# Patient Record
Sex: Female | Born: 1935 | Race: White | Hispanic: No | Smoking: Never smoker
Health system: Southern US, Community
[De-identification: ages and names within clinical notes are randomized; demographics above are authoritative.]

## PROBLEM LIST (undated history)

## (undated) DIAGNOSIS — Z86718 Personal history of other venous thrombosis and embolism: Secondary | ICD-10-CM

## (undated) DIAGNOSIS — C801 Malignant (primary) neoplasm, unspecified: Secondary | ICD-10-CM

## (undated) DIAGNOSIS — M792 Neuralgia and neuritis, unspecified: Secondary | ICD-10-CM

## (undated) DIAGNOSIS — F32A Depression, unspecified: Secondary | ICD-10-CM

## (undated) DIAGNOSIS — F329 Major depressive disorder, single episode, unspecified: Secondary | ICD-10-CM

## (undated) DIAGNOSIS — F419 Anxiety disorder, unspecified: Secondary | ICD-10-CM

## (undated) HISTORY — PX: TONSILLECTOMY: SUR1361

## (undated) HISTORY — PX: KNEE SURGERY: SHX244

## (undated) HISTORY — DX: Personal history of other venous thrombosis and embolism: Z86.718

## (undated) HISTORY — PX: BLADDER SURGERY: SHX569

## (undated) HISTORY — PX: CHOLECYSTECTOMY: SHX55

## (undated) HISTORY — PX: APPENDECTOMY: SHX54

## (undated) HISTORY — PX: BREAST SURGERY: SHX581

## (undated) HISTORY — PX: ABDOMINAL HYSTERECTOMY: SHX81

## (undated) HISTORY — PX: MASTECTOMY: SHX3

## (undated) HISTORY — PX: GALLBLADDER SURGERY: SHX652

---

## 1998-03-22 ENCOUNTER — Ambulatory Visit (HOSPITAL_COMMUNITY): Admission: RE | Admit: 1998-03-22 | Discharge: 1998-03-22 | Payer: Self-pay | Admitting: Neurology

## 1998-09-02 ENCOUNTER — Observation Stay (HOSPITAL_COMMUNITY): Admission: RE | Admit: 1998-09-02 | Discharge: 1998-09-04 | Payer: Self-pay | Admitting: Specialist

## 1999-01-21 ENCOUNTER — Inpatient Hospital Stay (HOSPITAL_COMMUNITY): Admission: EM | Admit: 1999-01-21 | Discharge: 1999-01-26 | Payer: Self-pay | Admitting: Internal Medicine

## 1999-01-22 ENCOUNTER — Encounter: Payer: Self-pay | Admitting: Specialist

## 1999-01-25 ENCOUNTER — Encounter: Payer: Self-pay | Admitting: Orthopedic Surgery

## 1999-02-13 ENCOUNTER — Emergency Department (HOSPITAL_COMMUNITY): Admission: EM | Admit: 1999-02-13 | Discharge: 1999-02-13 | Payer: Self-pay | Admitting: Emergency Medicine

## 1999-02-13 ENCOUNTER — Encounter: Payer: Self-pay | Admitting: Emergency Medicine

## 1999-08-08 ENCOUNTER — Inpatient Hospital Stay (HOSPITAL_COMMUNITY): Admission: EM | Admit: 1999-08-08 | Discharge: 1999-08-10 | Payer: Self-pay | Admitting: Emergency Medicine

## 1999-08-08 ENCOUNTER — Encounter: Payer: Self-pay | Admitting: Emergency Medicine

## 1999-08-09 ENCOUNTER — Encounter: Payer: Self-pay | Admitting: *Deleted

## 1999-12-08 ENCOUNTER — Inpatient Hospital Stay (HOSPITAL_COMMUNITY): Admission: EM | Admit: 1999-12-08 | Discharge: 1999-12-12 | Payer: Self-pay | Admitting: Emergency Medicine

## 1999-12-08 ENCOUNTER — Encounter: Payer: Self-pay | Admitting: Emergency Medicine

## 2000-07-29 ENCOUNTER — Ambulatory Visit (HOSPITAL_COMMUNITY): Admission: RE | Admit: 2000-07-29 | Discharge: 2000-07-29 | Payer: Self-pay | Admitting: *Deleted

## 2001-05-20 ENCOUNTER — Encounter: Admission: RE | Admit: 2001-05-20 | Discharge: 2001-05-20 | Payer: Self-pay | Admitting: Specialist

## 2001-05-20 ENCOUNTER — Encounter: Payer: Self-pay | Admitting: Specialist

## 2002-11-11 ENCOUNTER — Ambulatory Visit (HOSPITAL_COMMUNITY): Admission: RE | Admit: 2002-11-11 | Discharge: 2002-11-11 | Payer: Self-pay

## 2002-11-19 ENCOUNTER — Encounter: Admission: RE | Admit: 2002-11-19 | Discharge: 2002-11-19 | Payer: Self-pay | Admitting: Urology

## 2002-11-19 ENCOUNTER — Encounter: Payer: Self-pay | Admitting: Urology

## 2002-11-25 ENCOUNTER — Ambulatory Visit (HOSPITAL_BASED_OUTPATIENT_CLINIC_OR_DEPARTMENT_OTHER): Admission: RE | Admit: 2002-11-25 | Discharge: 2002-11-25 | Payer: Self-pay | Admitting: Urology

## 2008-11-12 DIAGNOSIS — C50919 Malignant neoplasm of unspecified site of unspecified female breast: Secondary | ICD-10-CM

## 2008-11-12 HISTORY — DX: Malignant neoplasm of unspecified site of unspecified female breast: C50.919

## 2014-11-03 ENCOUNTER — Emergency Department (HOSPITAL_BASED_OUTPATIENT_CLINIC_OR_DEPARTMENT_OTHER): Payer: Medicare Other

## 2014-11-03 ENCOUNTER — Encounter (HOSPITAL_BASED_OUTPATIENT_CLINIC_OR_DEPARTMENT_OTHER): Payer: Self-pay | Admitting: *Deleted

## 2014-11-03 ENCOUNTER — Emergency Department (HOSPITAL_BASED_OUTPATIENT_CLINIC_OR_DEPARTMENT_OTHER)
Admission: EM | Admit: 2014-11-03 | Discharge: 2014-11-03 | Disposition: A | Discharge status: Home / Self Care | Payer: Medicare Other | Attending: Emergency Medicine | Admitting: Emergency Medicine

## 2014-11-03 DIAGNOSIS — M549 Dorsalgia, unspecified: Secondary | ICD-10-CM | POA: Insufficient documentation

## 2014-11-03 DIAGNOSIS — R1011 Right upper quadrant pain: Secondary | ICD-10-CM | POA: Insufficient documentation

## 2014-11-03 DIAGNOSIS — F419 Anxiety disorder, unspecified: Secondary | ICD-10-CM | POA: Diagnosis not present

## 2014-11-03 DIAGNOSIS — M792 Neuralgia and neuritis, unspecified: Secondary | ICD-10-CM | POA: Insufficient documentation

## 2014-11-03 DIAGNOSIS — F329 Major depressive disorder, single episode, unspecified: Secondary | ICD-10-CM | POA: Insufficient documentation

## 2014-11-03 DIAGNOSIS — Z7982 Long term (current) use of aspirin: Secondary | ICD-10-CM | POA: Insufficient documentation

## 2014-11-03 DIAGNOSIS — R1031 Right lower quadrant pain: Secondary | ICD-10-CM | POA: Insufficient documentation

## 2014-11-03 DIAGNOSIS — I82812 Embolism and thrombosis of superficial veins of left lower extremities: Secondary | ICD-10-CM

## 2014-11-03 DIAGNOSIS — I82402 Acute embolism and thrombosis of unspecified deep veins of left lower extremity: Secondary | ICD-10-CM | POA: Diagnosis not present

## 2014-11-03 DIAGNOSIS — R300 Dysuria: Secondary | ICD-10-CM | POA: Diagnosis present

## 2014-11-03 DIAGNOSIS — N134 Hydroureter: Secondary | ICD-10-CM | POA: Diagnosis not present

## 2014-11-03 DIAGNOSIS — Z79899 Other long term (current) drug therapy: Secondary | ICD-10-CM | POA: Diagnosis not present

## 2014-11-03 DIAGNOSIS — Z859 Personal history of malignant neoplasm, unspecified: Secondary | ICD-10-CM | POA: Diagnosis not present

## 2014-11-03 DIAGNOSIS — M79662 Pain in left lower leg: Secondary | ICD-10-CM

## 2014-11-03 HISTORY — DX: Neuralgia and neuritis, unspecified: M79.2

## 2014-11-03 HISTORY — DX: Depression, unspecified: F32.A

## 2014-11-03 HISTORY — DX: Major depressive disorder, single episode, unspecified: F32.9

## 2014-11-03 HISTORY — DX: Anxiety disorder, unspecified: F41.9

## 2014-11-03 HISTORY — DX: Malignant (primary) neoplasm, unspecified: C80.1

## 2014-11-03 LAB — HEPATIC FUNCTION PANEL
ALK PHOS: 57 U/L (ref 39–117)
ALT: 9 U/L (ref 0–35)
AST: 21 U/L (ref 0–37)
Albumin: 3.7 g/dL (ref 3.5–5.2)
BILIRUBIN TOTAL: 0.5 mg/dL (ref 0.3–1.2)
Bilirubin, Direct: 0.1 mg/dL (ref 0.0–0.3)
Indirect Bilirubin: 0.4 mg/dL (ref 0.3–0.9)
Total Protein: 6.7 g/dL (ref 6.0–8.3)

## 2014-11-03 LAB — URINALYSIS, ROUTINE W REFLEX MICROSCOPIC
Bilirubin Urine: NEGATIVE
GLUCOSE, UA: NEGATIVE mg/dL
HGB URINE DIPSTICK: NEGATIVE
Ketones, ur: NEGATIVE mg/dL
LEUKOCYTES UA: NEGATIVE
Nitrite: NEGATIVE
PROTEIN: NEGATIVE mg/dL
SPECIFIC GRAVITY, URINE: 1.011 (ref 1.005–1.030)
Urobilinogen, UA: 0.2 mg/dL (ref 0.0–1.0)
pH: 8 (ref 5.0–8.0)

## 2014-11-03 LAB — CBC WITH DIFFERENTIAL/PLATELET
BASOS PCT: 1 % (ref 0–1)
Basophils Absolute: 0.1 10*3/uL (ref 0.0–0.1)
EOS ABS: 0.1 10*3/uL (ref 0.0–0.7)
EOS PCT: 1 % (ref 0–5)
HCT: 33.3 % — ABNORMAL LOW (ref 36.0–46.0)
Hemoglobin: 10.7 g/dL — ABNORMAL LOW (ref 12.0–15.0)
LYMPHS ABS: 1.9 10*3/uL (ref 0.7–4.0)
Lymphocytes Relative: 24 % (ref 12–46)
MCH: 30.2 pg (ref 26.0–34.0)
MCHC: 32.1 g/dL (ref 30.0–36.0)
MCV: 94.1 fL (ref 78.0–100.0)
Monocytes Absolute: 0.7 10*3/uL (ref 0.1–1.0)
Monocytes Relative: 9 % (ref 3–12)
Neutro Abs: 5.3 10*3/uL (ref 1.7–7.7)
Neutrophils Relative %: 65 % (ref 43–77)
PLATELETS: 185 10*3/uL (ref 150–400)
RBC: 3.54 MIL/uL — ABNORMAL LOW (ref 3.87–5.11)
RDW: 14.2 % (ref 11.5–15.5)
WBC: 8 10*3/uL (ref 4.0–10.5)

## 2014-11-03 LAB — BASIC METABOLIC PANEL
Anion gap: 7 (ref 5–15)
BUN: 17 mg/dL (ref 6–23)
CO2: 26 mmol/L (ref 19–32)
Calcium: 9.1 mg/dL (ref 8.4–10.5)
Chloride: 109 mEq/L (ref 96–112)
Creatinine, Ser: 1.04 mg/dL (ref 0.50–1.10)
GFR calc Af Amer: 53 mL/min — ABNORMAL LOW (ref >90)
GFR calc non Af Amer: 46 mL/min — ABNORMAL LOW (ref >90)
GLUCOSE: 94 mg/dL (ref 70–99)
Potassium: 4.2 mmol/L (ref 3.5–5.1)
SODIUM: 142 mmol/L (ref 135–145)

## 2014-11-03 MED ORDER — IOHEXOL 300 MG/ML  SOLN
100.0000 mL | Freq: Once | INTRAMUSCULAR | Status: AC | PRN
  Administered 2014-11-03: 100 mL via INTRAVENOUS

## 2014-11-03 MED ORDER — ASPIRIN EC 325 MG PO TBEC: 325.0000 mg | DELAYED_RELEASE_TABLET | Freq: Every day | ORAL | Status: DC

## 2014-11-03 MED ORDER — IOHEXOL 300 MG/ML  SOLN
25.0000 mL | Freq: Once | INTRAMUSCULAR | Status: AC | PRN
  Administered 2014-11-03: 25 mL via ORAL

## 2014-11-03 NOTE — ED Notes (Signed)
Pt ambulated to the restroom with 1 person assist. Pt did well, and was able to void

## 2014-11-03 NOTE — ED Provider Notes (Signed)
CSN: [id number]     Arrival date & time 11/03/14  0740 History   First MD Initiated Contact with Patient 11/03/14 [phone]     Chief Complaint  Patient presents with  . Dysuria     (Consider location/radiation/quality/duration/timing/severity/associated sxs/prior Treatment) HPI Comments: Patient reporting multiple complaints including urinary frequency and decreased urine output for approximately 3 days with intermittent dysuria.  She is also had some right lower quadrant, right upper quadrant abdominal pain during this time.  She reports having bilateral leg pain, though later reports it is localized to the left calf.   Patient is a 80 y.o. female presenting with dysuria.  Dysuria Associated symptoms: abdominal pain   Associated symptoms: no fever, no nausea and no vomiting     Past Medical History  Diagnosis Date  . Neuropathic pain   . Depression   . Anxiety   . Cancer    History reviewed. No pertinent past surgical history. History reviewed. No pertinent family history. History  Substance Use Topics  . Smoking status: Never Smoker   . Smokeless tobacco: Not on file  . Alcohol Use: Not on file   OB History    No data available     Review of Systems  Constitutional: Negative for fever, chills, diaphoresis, activity change, appetite change and fatigue.  HENT: Negative for congestion, facial swelling, rhinorrhea and sore throat.   Eyes: Negative for photophobia and discharge.  Respiratory: Negative for cough, chest tightness and shortness of breath.   Cardiovascular: Negative for chest pain, palpitations and leg swelling.       L calf pain  Gastrointestinal: Positive for abdominal pain. Negative for nausea, vomiting and diarrhea.  Endocrine: Negative for polydipsia and polyuria.  Genitourinary: Positive for frequency and decreased urine volume. Negative for difficulty urinating and pelvic pain.  Musculoskeletal: Positive for back pain. Negative for arthralgias, neck pain  and neck stiffness.  Skin: Negative for color change and wound.  Allergic/Immunologic: Negative for immunocompromised state.  Neurological: Negative for facial asymmetry, weakness, numbness and headaches.  Hematological: Does not bruise/bleed easily.  Psychiatric/Behavioral: Negative for confusion and agitation.      Allergies  Sulfa antibiotics  Home Medications   Prior to Admission medications   Medication Sig Start Date End Date Taking? Authorizing Provider  fentaNYL (DURAGESIC - DOSED MCG/HR) 75 MCG/HR Place 75 mcg onto the skin every 3 (three) days.   Yes Historical Provider, MD  gabapentin (NEURONTIN) 400 MG capsule Take 400 mg by mouth 4 (four) times daily. Total of 5 capsules per day   Yes Historical Provider, MD  sertraline (ZOLOFT) 100 MG tablet Take 100 mg by mouth daily.   Yes Historical Provider, MD  aspirin EC 325 MG tablet Take 1 tablet (325 mg total) by mouth daily. 11/03/14   Ernestina Patches, MD   BP 152/64 mmHg  Pulse 52  Temp(Src) 98.4 F (36.9 C) (Oral)  Resp 18  SpO2 96% Physical Exam  Constitutional: She is oriented to person, place, and time. She appears well-developed and well-nourished. No distress.  HENT:  Head: Normocephalic and atraumatic.  Mouth/Throat: No oropharyngeal exudate.  Eyes: Pupils are equal, round, and reactive to light.  Neck: Normal range of motion. Neck supple.  Cardiovascular: Normal rate, regular rhythm and normal heart sounds.  Exam reveals no gallop and no friction rub.   No murmur heard. Pulmonary/Chest: Effort normal and breath sounds normal. No respiratory distress. She has no wheezes. She has no rales.  Abdominal: Soft. Bowel sounds are normal.  She exhibits no distension and no mass. There is tenderness in the right upper quadrant and right lower quadrant. There is no rigidity, no rebound and no guarding.  Musculoskeletal: Normal range of motion. She exhibits no edema.       Back:       Left lower leg: She exhibits  tenderness. She exhibits no bony tenderness, no swelling and no edema.       Legs: Neurological: She is alert and oriented to person, place, and time.  Skin: Skin is warm and dry.  Psychiatric: She has a normal mood and affect.    ED Course  Procedures (including critical care time) Labs Review Labs Reviewed  CBC WITH DIFFERENTIAL - Abnormal; Notable for the following:    RBC 3.54 (*)    Hemoglobin 10.7 (*)    HCT 33.3 (*)    All other components within normal limits  BASIC METABOLIC PANEL - Abnormal; Notable for the following:    GFR calc non Af Amer 46 (*)    GFR calc Af Amer 53 (*)    All other components within normal limits  URINE CULTURE  URINALYSIS, ROUTINE W REFLEX MICROSCOPIC  HEPATIC FUNCTION PANEL    Imaging Review Dg Thoracic Spine 4v  11/03/2014   CLINICAL DATA:  Upper back pain, no known injury, initial encounter  EXAM: THORACIC SPINE - 4+ VIEW  COMPARISON:  06/25/2014  FINDINGS: Increased kyphosis is noted but stable. Mild degenerative changes of the thoracic spine are seen. No compression deformities are noted. Mild interstitial changes are noted in both lungs.  IMPRESSION: No acute abnormality noted.   Electronically Signed   By: Inez Catalina M.D.   On: 11/03/2014 10:48   Ct Abdomen Pelvis W Contrast  11/03/2014   CLINICAL DATA:  Unable to urinate since Friday.  Back pain.  EXAM: CT ABDOMEN AND PELVIS WITH CONTRAST  TECHNIQUE: Multidetector CT imaging of the abdomen and pelvis was performed using the standard protocol following bolus administration of intravenous contrast.  CONTRAST:  175mL OMNIPAQUE IOHEXOL 300 MG/ML SOLN, 33mL OMNIPAQUE IOHEXOL 300 MG/ML SOLN  COMPARISON:  08/08/2010  FINDINGS: Lower chest: There is no pleural effusion. The heart size appears moderately enlarged. Calcified atherosclerotic disease involves the LAD, left circumflex and RCA Coronary artery.  Hepatobiliary: There is no focal liver abnormality identified. The patient is status post  cholecystectomy. Moderate intrahepatic bile duct dilatation is again noted. The common bile duct is increased in caliber as before measuring up to 1.7 cm. No obstructing stone or mass noted.  Pancreas: Appears within normal limits.  Spleen: Appears normal.  Adrenals/Urinary Tract: Normal adrenal glands. There is bilateral pelvocaliectasis. Right hydroureter noted. Cyst within the inferior pole of the left kidney is identified measuring 1.2 cm. Within the upper pole of the left kidney there is a 1.4 cm cyst. The urinary bladder is unremarkable.  Stomach/Bowel: The stomach is normal. The small bowel loops have a normal course and caliber without evidence for bowel obstruction. Normal appearance of the proximal colon. Multiple distal colonic diverticula identified without acute inflammation.  Vascular/Lymphatic: Calcified atherosclerotic disease involves the abdominal aorta. No aneurysm. The abdominal aorta is tortuous. No enlarged retroperitoneal or mesenteric adenopathy. No enlarged pelvic or inguinal lymph nodes.  Reproductive: Previous hysterectomy. Left ovarian cyst is again noted measuring 3 cm, image 67/series 2. Previously 2.2 cm.  Other: There is no ascites or focal fluid collections within the abdomen or pelvis.  Musculoskeletal: There is a scoliosis deformity involving the lumbar spine  which is convex towards the left. Multi level degenerative disc disease is identified involving the lumbar spine. The patient is status post posterior lumbar decompression at L3 through L5.  IMPRESSION: 1. There is mild bilateral pelvocaliectasis and right hydroureter. No evidence for high-grade obstructive uropathy bilaterally. The urinary bladder is unremarkable in appearance. 2. Prior cholecystectomy with marked increase caliber of the intra and extrahepatic bile ducts. This is unchanged from 2011 compatible with a chronic abnormality. 3. Atherosclerotic disease.   Electronically Signed   By: Kerby Moors M.D.   On:  11/03/2014 11:26   US Venous Img Lower Unilateral Left  11/03/2014   CLINICAL DATA:  Left calf pain following fall 4 days previous  EXAM: Left LOWER EXTREMITY VENOUS DOPPLER ULTRASOUND  TECHNIQUE: Gray-scale sonography with graded compression, as well as color Doppler and duplex ultrasound were performed to evaluate the lower extremity deep venous systems from the level of the common femoral vein and including the common femoral, femoral, profunda femoral, popliteal and calf veins including the posterior tibial, peroneal and gastrocnemius veins when visible. The superficial great saphenous vein was also interrogated. Spectral Doppler was utilized to evaluate flow at rest and with distal augmentation maneuvers in the common femoral, femoral and popliteal veins.  COMPARISON:  None.  FINDINGS: Contralateral Common Femoral Vein: Respiratory phasicity is normal and symmetric with the symptomatic side. No evidence of thrombus. Normal compressibility.  Common Femoral Vein: No evidence of thrombus. Normal compressibility, respiratory phasicity and response to augmentation.  Saphenofemoral Junction: No evidence of thrombus. Normal compressibility and flow on color Doppler imaging.  Profunda Femoral Vein: No evidence of thrombus. Normal compressibility and flow on color Doppler imaging.  Femoral Vein: No evidence of thrombus. Normal compressibility, respiratory phasicity and response to augmentation.  Popliteal Vein: No evidence of thrombus. Normal compressibility, respiratory phasicity and response to augmentation.  Calf Veins: No evidence of thrombus. Normal compressibility and flow on color Doppler imaging.  Superficial Great Saphenous Vein: There is a very decreased compressibility in the superior aspect of the calf within the greater saphenous vein which may represent a small area of nonocclusive thrombus.  Venous Reflux:  None.  Other Findings:  None.  IMPRESSION: No deep venous thrombosis is noted. There is a focal  area suspicious for nonocclusive thrombus within the greater saphenous vein in the superior calf.   Electronically Signed   By: Inez Catalina M.D.   On: 11/03/2014 11:26     EKG Interpretation None      MDM   Final diagnoses:  Back pain  Calf pain, left  Acute superficial venous thrombosis of left lower extremity  Hydroureter, right    Pt is a 80 y.o. female with Pmhx as above who presents with multiple complaints including 3 days of urinary frequency with decreased urine output, right lower quadrant and right upper quadrant abdominal pain, left calf pain, and back pain.  Patient also reporting that she had a mechanical fall yesterday and fell onto her back, but did not tell anybody.  She had no loss of consciousness and she denies headache or neck pain.  Son reports she is at her baseline mental status.  Neurologic exam is unremarkable.  Cardiopulmonary exam is benign.  Abdominal exam with right upper quadrant and right lower quadrant tenderness to palpation without rebound or guarding.  Positive right calf tenderness without appreciable swelling.  She reports having multiple abdominal surgeries, which she thinks includes cholecystectomy and appendectomy, but is not sure.  Son states that she  has history of UTIs but has not had kidney stones.  She is also had a history of a bladder surgery in the past.  UA is not consistent with a UTI and I do not feel that her abdominal exam is reliable.   CT shows bilateral mild pelvocaliectasis ectasis and right hydroureter but no evidence for high-grade obstructive uropathy.  Urinary bladder is unremarkable.  She has increased caliper of intra-and extra hepatic bile ducts though this is unchanged from 2011.  She is no acute findings of her thoracic spine x-ray.  Her DVT study is positive for a small area of nonocclusive thrombus in the superficial greater saphenous vein.  I attempted to call Dr. Felipa Eth who is her urologist.  His office is already closed.   Given that she has urinated on her own in the department, her urine looks normal and her creatinine is also normal.  I do not feel that these urinary findings on CT require further intervention today and she can follow-up closely with Dr. Felipa Eth.  I will place her on 325 of aspirin for the superficial thrombosis and recommend elevation and warm and cold compresses.  She can follow up with her primary physician in one week for repeat ultrasound if needed.    Ky Barban evaluation in the Emergency Department is complete. It has been determined that no acute conditions requiring further emergency intervention are present at this time. The patient/guardian have been advised of the diagnosis and plan. We have discussed signs and symptoms that warrant return to the ED, such as changes or worsening in symptoms, inability to urinate for more than 6 hours, worsening pain, fever, worsening swelling or development of redness of the leg.      Ernestina Patches, MD 11/03/14 1344

## 2014-11-03 NOTE — ED Notes (Signed)
Fluid offer to patient.

## 2014-11-03 NOTE — ED Notes (Signed)
Assisted pt to the restroom, pt sat there for a long time and unable to void. Pt reports not able to void in the past 2 days. Bladder scan show 319 ml of urine. Md informed

## 2014-11-03 NOTE — Discharge Instructions (Signed)
Acute Urinary Retention Acute urinary retention is the temporary inability to urinate. This is an uncommon problem in women. It can be caused by:  Infection.  A side effect of a medicine.  A problem in a nearby organ that presses or squeezes on the bladder or the urethra (the tube that drains the bladder).  Psychological problems.   Surgery on your bladder, urethra, or pelvic organs that causes obstruction to the outflow of urine from your bladder. HOME CARE INSTRUCTIONS  If you are sent home with a Foley catheter and a drainage system, you will need to discuss the best course of action with your health care provider. While the catheter is in, maintain a good intake of fluids. Keep the drainage bag emptied and lower than your catheter. This is so that contaminated urine will not flow back into your bladder, which could lead to a urinary tract infection. There are two main types of drainage bags. One is a large bag that usually is used at night. It has a good capacity that will allow you to sleep through the night without having to empty it. The second type is called a leg bag. It has a smaller capacity so it needs to be emptied more frequently. However, the main advantage is that it can be attached by a leg strap and goes underneath your clothing, allowing you the freedom to move about or leave your home. Only take over-the-counter or prescription medicines for pain, discomfort, or fever as directed by your health care provider.  SEEK MEDICAL CARE IF:  You develop a low-grade fever.  You experience spasms or leakage of urine with the spasms. SEEK IMMEDIATE MEDICAL CARE IF:   You develop chills or fever.  Your catheter stops draining urine.  Your catheter falls out.  You start to develop increased bleeding that does not respond to rest and increased fluid intake. MAKE SURE YOU:  Understand these instructions.  Will watch your condition.  Will get help right away if you are not  doing well or get worse. Document Released: 10/28/2006 Document Revised: 08/19/2013 Document Reviewed: 04/09/2013 [facility] Patient Information 2015 [place], [place]. This information is not intended to replace advice given to you by your health care provider. Make sure you discuss any questions you have with your health care provider.   Phlebitis Phlebitis is soreness and swelling (inflammation) of a vein. This can occur in your arms, legs, or torso (trunk), as well as deeper inside your body. Phlebitis is usually not serious when it occurs close to the surface of the body. However, it can cause serious problems when it occurs in a vein deeper inside the body. CAUSES  Phlebitis can be triggered by various things, including:   Reduced blood flow through your veins. This can happen with:  Bed rest over a long period.  Long-distance travel.  Injury.  Surgery.  Being overweight (obese) or pregnant.  Having an IV tube put in the vein and getting certain medicines through the vein.  Cancer and cancer treatment.  Use of illegal drugs taken through the vein.  Inflammatory diseases.  Inherited (genetic) diseases that increase the risk of blood clots.  Hormone therapy, such as birth control pills. SIGNS AND SYMPTOMS   Red, tender, swollen, and painful area on your skin. Usually, the area will be long and narrow.  Firmness along the center of the affected area. This can indicate that a blood clot has formed.  Low-grade fever. DIAGNOSIS  A health care provider can usually  diagnose phlebitis by examining the affected area and asking about your symptoms. To check for infection or blood clots, your health care provider may order blood tests or an ultrasound exam of the area. Blood tests and your family history may also indicate if you have an underlying genetic disease that causes blood clots. Occasionally, a piece of tissue is taken from the body (biopsy sample) if an unusual cause of  phlebitis is suspected. TREATMENT  Treatment will vary depending on the severity of the condition and the area of the body affected. Treatment may include:  Use of a warm compress or heating pad.  Use of compression stockings or bandages.  Anti-inflammatory medicines.  Removal of any IV tube that may be causing the problem.  Medicines that kill germs (antibiotics) if an infection is present.  Blood-thinning medicines if a blood clot is suspected or present.  In rare cases, surgery may be needed to remove damaged sections of vein. HOME CARE INSTRUCTIONS   Only take over-the-counter or prescription medicines as directed by your health care provider. Take all medicines exactly as prescribed.  Raise (elevate) the affected area above the level of your heart as directed by your health care provider.  Apply a warm compress or heating pad to the affected area as directed by your health care provider. Do not sleep with the heating pad.  Use compression stockings or bandages as directed. These will speed healing and prevent the condition from coming back.  If you are on blood thinners:  Get follow-up blood tests as directed by your health care provider.  Check with your health care provider before using any new medicines.  Carry a medical alert card or wear your medical alert jewelry to show that you are on blood thinners.  For phlebitis in the legs:  Avoid prolonged standing or bed rest.  Keep your legs moving. Raise your legs when sitting or lying.  Do not smoke.  Women, particularly those over the age of 72, should consider the risks and benefits of taking the contraceptive pill. This kind of hormone treatment can increase your risk for blood clots.  Follow up with your health care provider as directed. SEEK MEDICAL CARE IF:   You have unusual bruising or any bleeding problems.  Your swelling or pain in the affected area is not improving.  You are on anti-inflammatory  medicine, and you develop belly (abdominal) pain. SEEK IMMEDIATE MEDICAL CARE IF:   You have a sudden onset of chest pain or difficulty breathing.  You have a fever or persistent symptoms for more than 2-3 days.  You have a fever and your symptoms suddenly get worse. MAKE SURE YOU:  Understand these instructions.  Will watch your condition.  Will get help right away if you are not doing well or get worse. Document Released: 10/23/2001 Document Revised: 08/19/2013 Document Reviewed: 07/06/2013 [facility] Patient Information 2015 [place], [place]. This information is not intended to replace advice given to you by your health care provider. Make sure you discuss any questions you have with your health care provider.

## 2014-11-03 NOTE — ED Notes (Signed)
Pt to triage in w/c, reports 3 days of burning dysuria, frequency, urgency and dec uop.

## 2014-11-03 NOTE — ED Notes (Signed)
In and out cath done. Pt tolerated well. Urine specimen send

## 2014-11-04 LAB — URINE CULTURE
Colony Count: NO GROWTH
Culture: NO GROWTH

## 2014-12-23 ENCOUNTER — Emergency Department (HOSPITAL_BASED_OUTPATIENT_CLINIC_OR_DEPARTMENT_OTHER)
Admission: EM | Admit: 2014-12-23 | Discharge: 2014-12-23 | Disposition: A | Discharge status: Home / Self Care | Payer: Medicare Other | Attending: Emergency Medicine | Admitting: Emergency Medicine

## 2014-12-23 ENCOUNTER — Encounter (HOSPITAL_BASED_OUTPATIENT_CLINIC_OR_DEPARTMENT_OTHER): Payer: Self-pay

## 2014-12-23 ENCOUNTER — Emergency Department (HOSPITAL_BASED_OUTPATIENT_CLINIC_OR_DEPARTMENT_OTHER): Payer: Medicare Other

## 2014-12-23 DIAGNOSIS — F419 Anxiety disorder, unspecified: Secondary | ICD-10-CM | POA: Diagnosis not present

## 2014-12-23 DIAGNOSIS — S50312A Abrasion of left elbow, initial encounter: Secondary | ICD-10-CM | POA: Insufficient documentation

## 2014-12-23 DIAGNOSIS — G629 Polyneuropathy, unspecified: Secondary | ICD-10-CM | POA: Diagnosis not present

## 2014-12-23 DIAGNOSIS — S40022A Contusion of left upper arm, initial encounter: Secondary | ICD-10-CM | POA: Diagnosis not present

## 2014-12-23 DIAGNOSIS — Y998 Other external cause status: Secondary | ICD-10-CM | POA: Diagnosis not present

## 2014-12-23 DIAGNOSIS — Z79899 Other long term (current) drug therapy: Secondary | ICD-10-CM | POA: Diagnosis not present

## 2014-12-23 DIAGNOSIS — W01198A Fall on same level from slipping, tripping and stumbling with subsequent striking against other object, initial encounter: Secondary | ICD-10-CM | POA: Insufficient documentation

## 2014-12-23 DIAGNOSIS — Y9289 Other specified places as the place of occurrence of the external cause: Secondary | ICD-10-CM | POA: Diagnosis not present

## 2014-12-23 DIAGNOSIS — Y9389 Activity, other specified: Secondary | ICD-10-CM | POA: Insufficient documentation

## 2014-12-23 DIAGNOSIS — S59902A Unspecified injury of left elbow, initial encounter: Secondary | ICD-10-CM | POA: Diagnosis present

## 2014-12-23 DIAGNOSIS — F329 Major depressive disorder, single episode, unspecified: Secondary | ICD-10-CM | POA: Insufficient documentation

## 2014-12-23 DIAGNOSIS — Z23 Encounter for immunization: Secondary | ICD-10-CM | POA: Insufficient documentation

## 2014-12-23 DIAGNOSIS — S51012A Laceration without foreign body of left elbow, initial encounter: Secondary | ICD-10-CM

## 2014-12-23 DIAGNOSIS — Z7982 Long term (current) use of aspirin: Secondary | ICD-10-CM | POA: Insufficient documentation

## 2014-12-23 DIAGNOSIS — W19XXXA Unspecified fall, initial encounter: Secondary | ICD-10-CM

## 2014-12-23 DIAGNOSIS — Z859 Personal history of malignant neoplasm, unspecified: Secondary | ICD-10-CM | POA: Diagnosis not present

## 2014-12-23 MED ORDER — ACETAMINOPHEN 325 MG PO TABS
650.0000 mg | ORAL_TABLET | Freq: Once | ORAL | Status: AC
  Administered 2014-12-23: 650 mg via ORAL
  Filled 2014-12-23: qty 2

## 2014-12-23 MED ORDER — TETANUS-DIPHTH-ACELL PERTUSSIS 5-2.5-18.5 LF-MCG/0.5 IM SUSP
0.5000 mL | Freq: Once | INTRAMUSCULAR | Status: AC
Start: 2014-12-23 — End: 2014-12-23
  Administered 2014-12-23: 0.5 mL via INTRAMUSCULAR
  Filled 2014-12-23: qty 0.5

## 2014-12-23 NOTE — ED Provider Notes (Signed)
CSN: [id number]     Arrival date & time 12/23/14  2263 History   First MD Initiated Contact with Patient 12/23/14 [phone]     Chief Complaint  Patient presents with  . Fall     (Consider location/radiation/quality/duration/timing/severity/associated sxs/prior Treatment) Patient is a 81 y.o. female presenting with fall. The history is provided by the patient.  Fall This is a new problem. The current episode started 1 to 2 hours ago. Episode frequency: once. The problem has been resolved. Pertinent negatives include no chest pain, no abdominal pain, no headaches and no shortness of breath. Nothing aggravates the symptoms. Nothing relieves the symptoms. She has tried nothing for the symptoms. The treatment provided no relief.    Past Medical History  Diagnosis Date  . Neuropathic pain   . Depression   . Anxiety   . Cancer    Past Surgical History  Procedure Laterality Date  . Abdominal hysterectomy    . Appendectomy    . Cholecystectomy    . Tonsillectomy     History reviewed. No pertinent family history. History  Substance Use Topics  . Smoking status: Never Smoker   . Smokeless tobacco: Not on file  . Alcohol Use: Not on file   OB History    No data available     Review of Systems  Constitutional: Negative for fever and fatigue.  HENT: Negative for congestion and drooling.   Eyes: Negative for pain.  Respiratory: Negative for cough and shortness of breath.   Cardiovascular: Negative for chest pain.  Gastrointestinal: Negative for nausea, vomiting, abdominal pain and diarrhea.  Genitourinary: Negative for dysuria and hematuria.  Musculoskeletal: Negative for back pain, gait problem and neck pain.  Skin: Negative for color change.  Neurological: Negative for dizziness and headaches.  Hematological: Negative for adenopathy.  Psychiatric/Behavioral: Negative for behavioral problems.  All other systems reviewed and are negative.     Allergies  Sulfa  antibiotics  Home Medications   Prior to Admission medications   Medication Sig Start Date End Date Taking? Authorizing Provider  fentaNYL (DURAGESIC - DOSED MCG/HR) 75 MCG/HR Place 75 mcg onto the skin every 3 (three) days.   Yes Historical Provider, MD  gabapentin (NEURONTIN) 400 MG capsule Take 400 mg by mouth 4 (four) times daily. Total of 5 capsules per day   Yes Historical Provider, MD  omeprazole (PRILOSEC) 20 MG capsule Take 20 mg by mouth daily.   Yes Historical Provider, MD  sertraline (ZOLOFT) 100 MG tablet Take 100 mg by mouth daily.   Yes Historical Provider, MD  aspirin EC 325 MG tablet Take 1 tablet (325 mg total) by mouth daily. 11/03/14   Ernestina Patches, MD   BP 190/74 mmHg  Pulse 69  Temp(Src) 97.4 F (36.3 C) (Oral)  Resp 18  SpO2 100% Physical Exam  Constitutional: She is oriented to person, place, and time. She appears well-developed and well-nourished.  HENT:  Head: Normocephalic.  Mouth/Throat: Oropharynx is clear and moist. No oropharyngeal exudate.  Eyes: Conjunctivae and EOM are normal. Pupils are equal, round, and reactive to light.  Neck: Normal range of motion. Neck supple.  Cardiovascular: Normal rate, regular rhythm, normal heart sounds and intact distal pulses.  Exam reveals no gallop and no friction rub.   No murmur heard. Pulmonary/Chest: Effort normal and breath sounds normal. No respiratory distress. She has no wheezes.  Abdominal: Soft. Bowel sounds are normal. There is no tenderness. There is no rebound and no guarding.  Musculoskeletal: Normal range  of motion. She exhibits tenderness. She exhibits no edema.  Mild abrasions and moderate sized skin tear to the left elbow and left lateral upper arm. Tenderness to palpation of the left upper arm and left elbow.  Normal motor skills in the left hand. 2+ distal pulses in the upper extremities.  Old appearing ecchymosis to the right lateral upper arm.  Neurological: She is alert and oriented to  person, place, and time.  alert, oriented x3 speech: normal in context and clarity memory: intact grossly cranial nerves II-XII: intact motor strength: full proximally and distally sensation: intact to light touch diffusely     Skin: Skin is warm and dry.  Psychiatric: She has a normal mood and affect. Her behavior is normal.  Nursing note and vitals reviewed.   ED Course  Procedures (including critical care time) Labs Review Labs Reviewed - No data to display  Imaging Review Dg Elbow Complete Left  12/23/2014   CLINICAL DATA:  Elbow pain secondary to a fall today.  EXAM: LEFT ELBOW - COMPLETE 3+ VIEW  COMPARISON:  None.  FINDINGS: There is no fracture or dislocation or joint effusion. Minimal arthritic changes.  IMPRESSION: No significant abnormality.   Electronically Signed   By: Lorriane Shire M.D.   On: 12/23/2014 10:14   Dg Humerus Left  12/23/2014   CLINICAL DATA:  Left upper arm and elbow pain secondary to a fall today.  EXAM: LEFT HUMERUS - 2+ VIEW  COMPARISON:  None.  FINDINGS: There is no fracture or dislocation. Mild cystic degenerative changes in the greater tuberosity of the proximal humerus.  IMPRESSION: No significant abnormality.   Electronically Signed   By: Lorriane Shire M.D.   On: 12/23/2014 10:14     EKG Interpretation None      MDM   Final diagnoses:  Fall  Skin tear of elbow without complication, left, initial encounter  Contusion of left arm, initial encounter    9:49 AM 81 y.o. female who presents with a mechanical fall which occurred about 1.5 hours ago. She states that she had gotten up to use the bathroom and slipped on the vinyl floor falling backwards and hitting the back of her head on the carpet. She denies any loss of consciousness. She denies any headache. Her only complaint is some left elbow and left arm pain. She is afebrile and  Mildly hypertensive here. I discussed CT imaging of the head with the family but do not think this is necessary  given no evidence of head trauma on exam and patient denies headache. The family is happy with this.  10:49 AM: I interpreted/reviewed the labs and/or imaging which were non-contributory.  Likely contusion. DTaP was updated. The patient continues to appear well and is ambulatory. Will recommend symptomatically treatment for the skin tear on the left arm. We'll place in a sling for comfort but patient and family were warned about frozen shoulder. I have discussed the diagnosis/risks/treatment options with the patient and family and believe the pt to be eligible for discharge home to follow-up with her pcp as needed. We also discussed returning to the ED immediately if new or worsening sx occur. We discussed the sx which are most concerning (e.g., further falls, worsening pain) that necessitate immediate return. Medications administered to the patient during their visit and any new prescriptions provided to the patient are listed below.  Medications given during this visit Medications  acetaminophen (TYLENOL) tablet 650 mg (650 mg Oral Given 12/23/14 1012)  Tdap (Remy)  injection 0.5 mL (0.5 mLs Intramuscular Given 12/23/14 1014)    New Prescriptions   No medications on file     Pamella Pert, MD 12/23/14 1050

## 2014-12-23 NOTE — Discharge Instructions (Signed)
Contusion °A contusion is a deep bruise. Contusions happen when an injury causes bleeding under the skin. Signs of bruising include pain, puffiness (swelling), and discolored skin. The contusion may turn blue, purple, or yellow. °HOME CARE  °· Put ice on the injured area. °¨ Put ice in a plastic bag. °¨ Place a towel between your skin and the bag. °¨ Leave the ice on for 15-20 minutes, 03-04 times a day. °· Only take medicine as told by your doctor. °· Rest the injured area. °· If possible, raise (elevate) the injured area to lessen puffiness. °GET HELP RIGHT AWAY IF:  °· You have more bruising or puffiness. °· You have pain that is getting worse. °· Your puffiness or pain is not helped by medicine. °MAKE SURE YOU:  °· Understand these instructions. °· Will watch your condition. °· Will get help right away if you are not doing well or get worse. °Document Released: 04/16/2008 Document Revised: 01/21/2012 Document Reviewed: 09/03/2011 °Exit[employer]® Patient Information ©2015 [employer]. This information is not intended to replace advice given to you by your health care provider. Make sure you discuss any questions you have with your health care provider. ° °

## 2014-12-23 NOTE — ED Notes (Signed)
LUE propped up on a pillow for comfort

## 2014-12-23 NOTE — ED Notes (Signed)
Patient transported to X-ray 

## 2014-12-23 NOTE — ED Notes (Signed)
Pt reports she slipped this morning on bathroom tile floor, denies being dizzy, reports hitting the back side of her head, denies being on blood thinners, denies LOC. Left upper arm, left elbow skin tears noted - also edema noted to left upper arm with pain.

## 2014-12-23 NOTE — ED Notes (Signed)
MD at bedside. 

## 2016-08-15 IMAGING — CT CT ABD-PELV W/ CM
2 of 5 series · 15 of 46 positions shown, 17 images · IV contrast (APPLIED)
Comparison: 08/08/2010

CLINICAL DATA: Unable to urinate since [REDACTED].  Back pain.

EXAM:
CT ABDOMEN AND PELVIS WITH CONTRAST
TECHNIQUE: Multidetector CT imaging of the abdomen and pelvis was performed
using the standard protocol following bolus administration of
intravenous contrast.
CONTRAST:  100mL OMNIPAQUE IOHEXOL 300 MG/ML SOLN, 25mL OMNIPAQUE
IOHEXOL 300 MG/ML SOLN

[Series 2: abd/pelvis 5.0 b31f · axial · 0.76mm/px · z∈[-431,+19]mm · 12 of 100 slices shown, 14 images]
[im 5/100  soft-tissue]
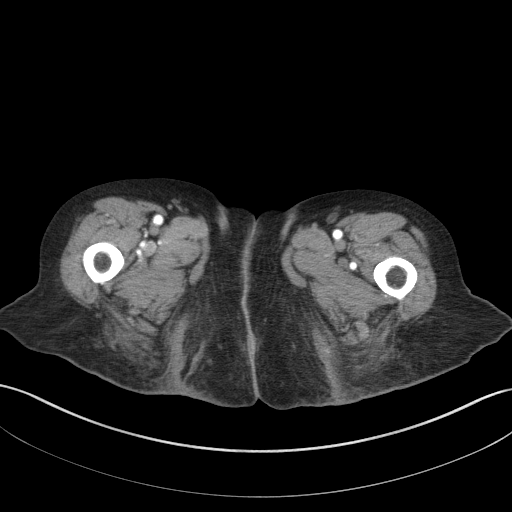
[im 5/100  bone]
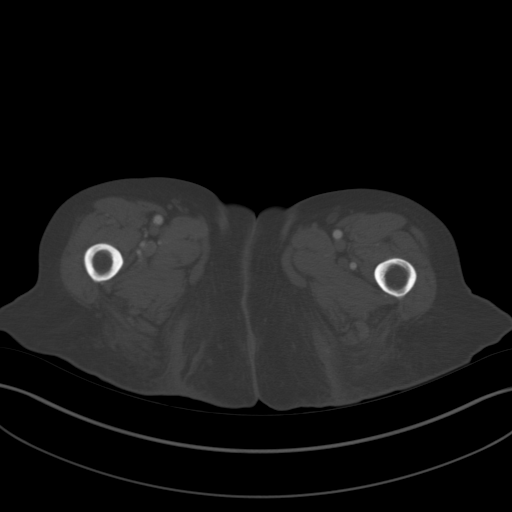
[im 15/100  soft-tissue]
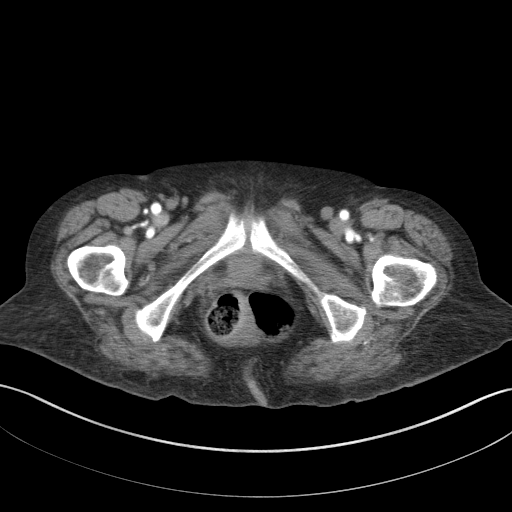
[im 20/100  soft-tissue]
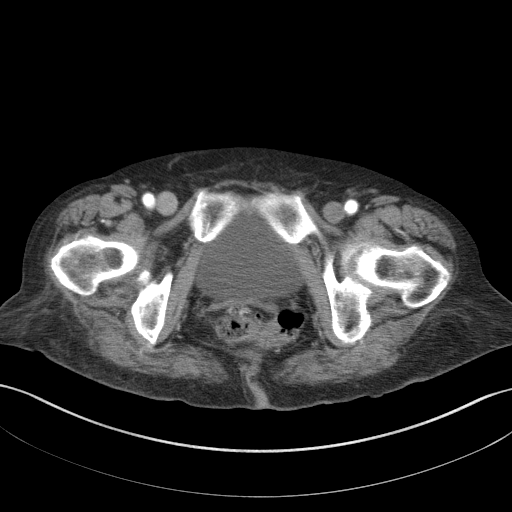
[im 30/100  soft-tissue]
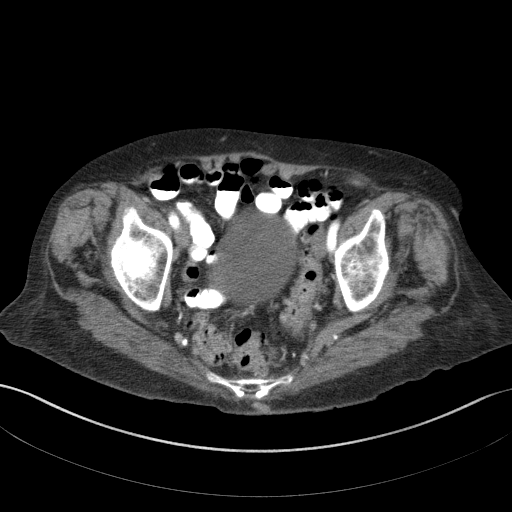
[im 40/100  soft-tissue]
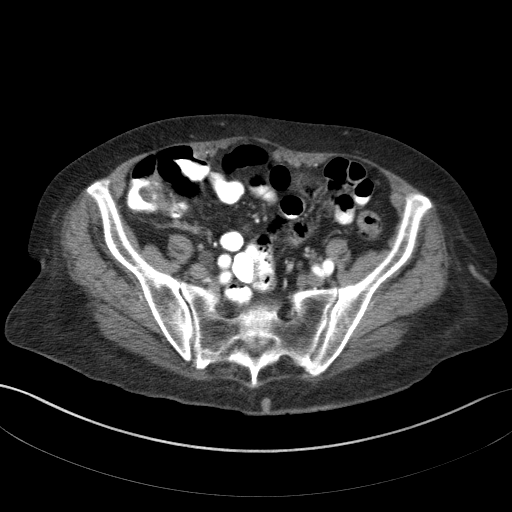
[im 45/100  soft-tissue]
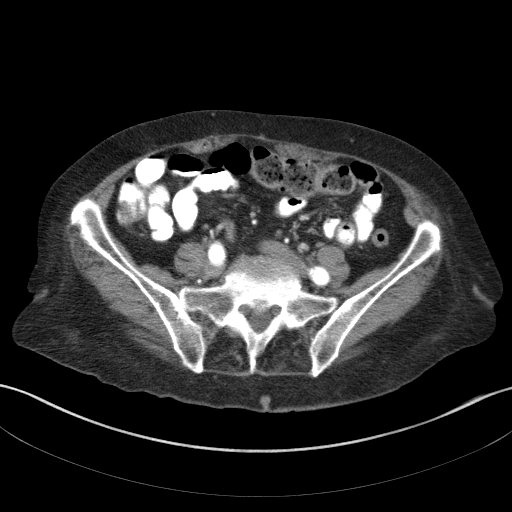
[im 55/100  soft-tissue]
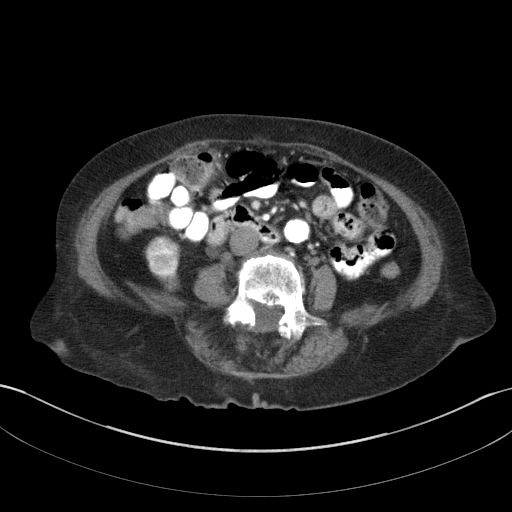
[im 60/100  soft-tissue]
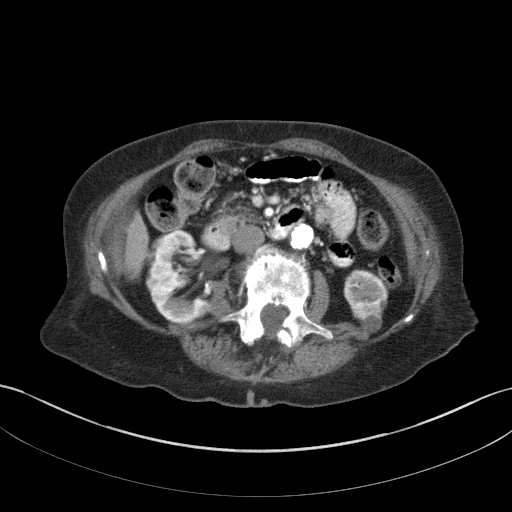
[im 70/100  soft-tissue]
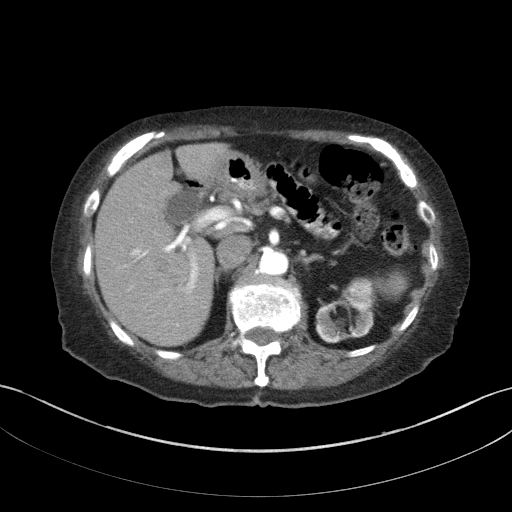
[im 70/100  bone]
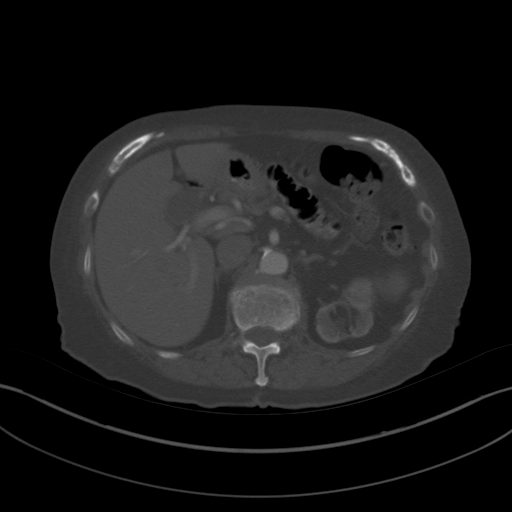
[im 80/100  soft-tissue]
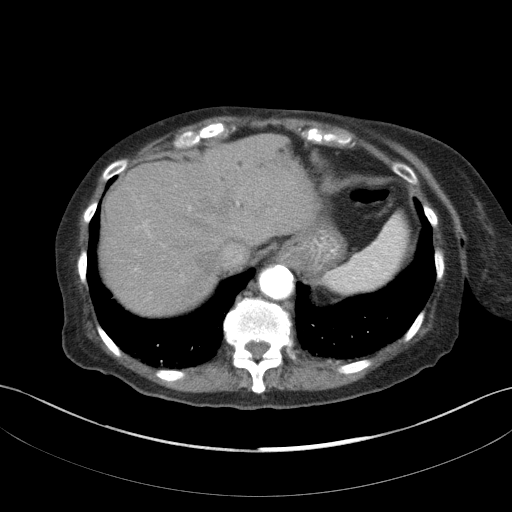
[im 85/100  soft-tissue]
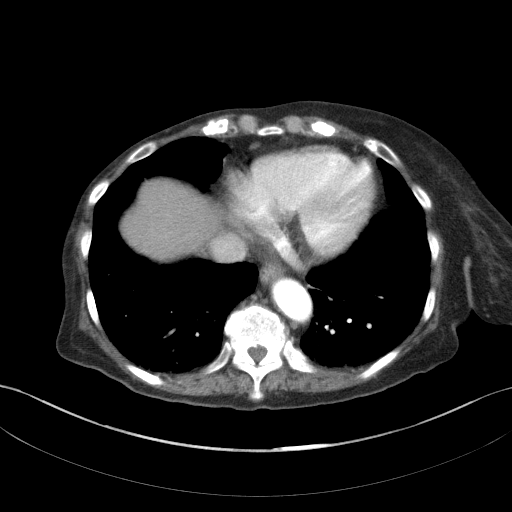
[im 95/100  soft-tissue]
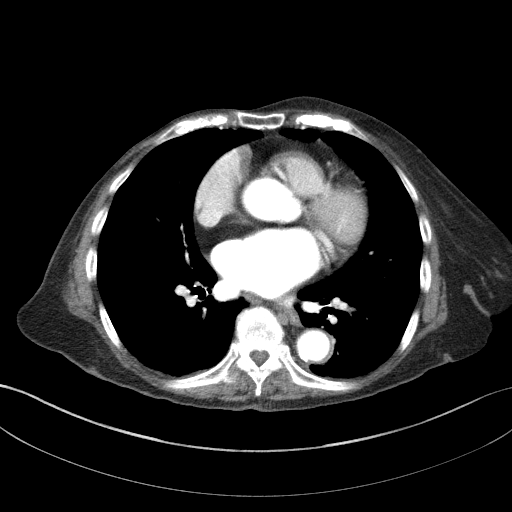

[Series 5: abd/pelvis 3.0 coronal · coronal · 0.79mm/px · 3 of 79 slices shown]
[im 27/79  soft-tissue]
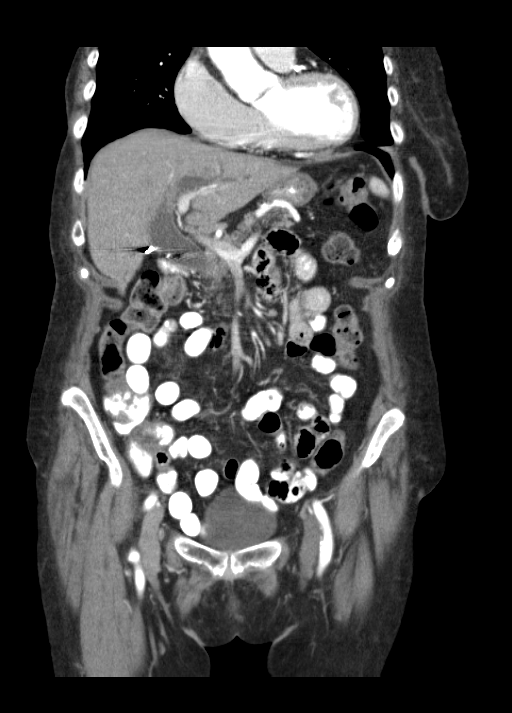
[im 35/79  soft-tissue]
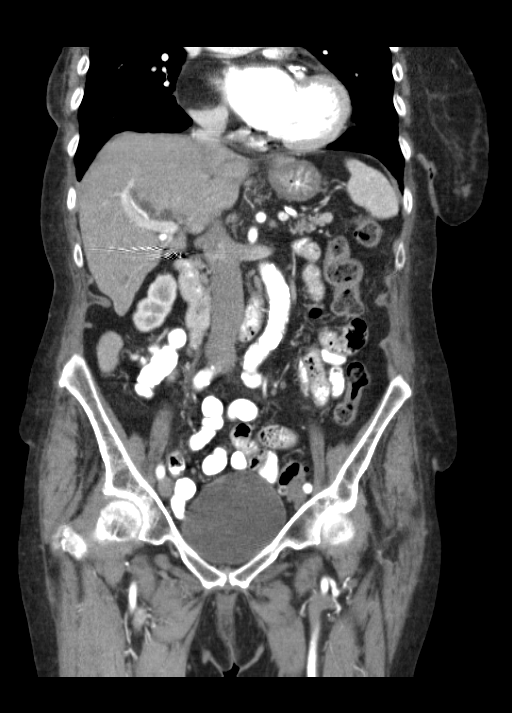
[im 44/79  soft-tissue]
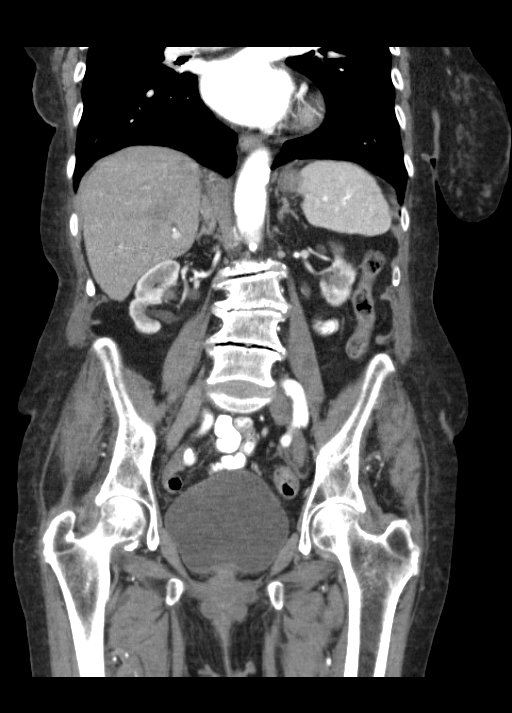

[15 of 46 positions shown; findings below may reference images not displayed]

FINDINGS: Lower chest: There is no pleural effusion. The heart size appears
moderately enlarged. Calcified atherosclerotic disease involves the
LAD, left circumflex and RCA Coronary artery.

Hepatobiliary: There is no focal liver abnormality identified. The
patient is status post cholecystectomy. Moderate intrahepatic bile
duct dilatation is again noted. The common bile duct is increased in
caliber as before measuring up to 1.7 cm. No obstructing stone or
mass noted.

Pancreas: Appears within normal limits.

Spleen: Appears normal.

Adrenals/Urinary Tract: Normal adrenal glands. There is bilateral
pelvocaliectasis. Right hydroureter noted. Cyst within the inferior
pole of the left kidney is identified measuring 1.2 cm. Within the
upper pole of the left kidney there is a 1.4 cm cyst. The urinary
bladder is unremarkable.

Stomach/Bowel: The stomach is normal. The small bowel loops have a
normal course and caliber without evidence for bowel obstruction.
Normal appearance of the proximal colon. Multiple distal colonic
diverticula identified without acute inflammation.

Vascular/Lymphatic: Calcified atherosclerotic disease involves the
abdominal aorta. No aneurysm. The abdominal aorta is tortuous. No
enlarged retroperitoneal or mesenteric adenopathy. No enlarged
pelvic or inguinal lymph nodes.

Reproductive: Previous hysterectomy. Left ovarian cyst is again
noted measuring 3 cm, image 67/series 2. Previously 2.2 cm.

Other: There is no ascites or focal fluid collections within the
abdomen or pelvis.

Musculoskeletal: There is a scoliosis deformity involving the lumbar
spine which is convex towards the left. Multi level degenerative
disc disease is identified involving the lumbar spine. The patient
is status post posterior lumbar decompression at L3 through L5.
IMPRESSION: 1. There is mild bilateral pelvocaliectasis and right hydroureter.
No evidence for high-grade obstructive uropathy bilaterally. The
urinary bladder is unremarkable in appearance.
2. Prior cholecystectomy with marked increase caliber of the intra
and extrahepatic bile ducts. This is unchanged from 0088 compatible
with a chronic abnormality.
3. Atherosclerotic disease.

## 2016-10-04 IMAGING — CR DG HUMERUS 2V *L*
2 series · 2 of 2 positions shown · non-contrast
Comparison: None.

CLINICAL DATA: Left upper arm and elbow pain secondary to a fall
today.

EXAM:
LEFT HUMERUS - 2+ VIEW

[w humerus ap left *]
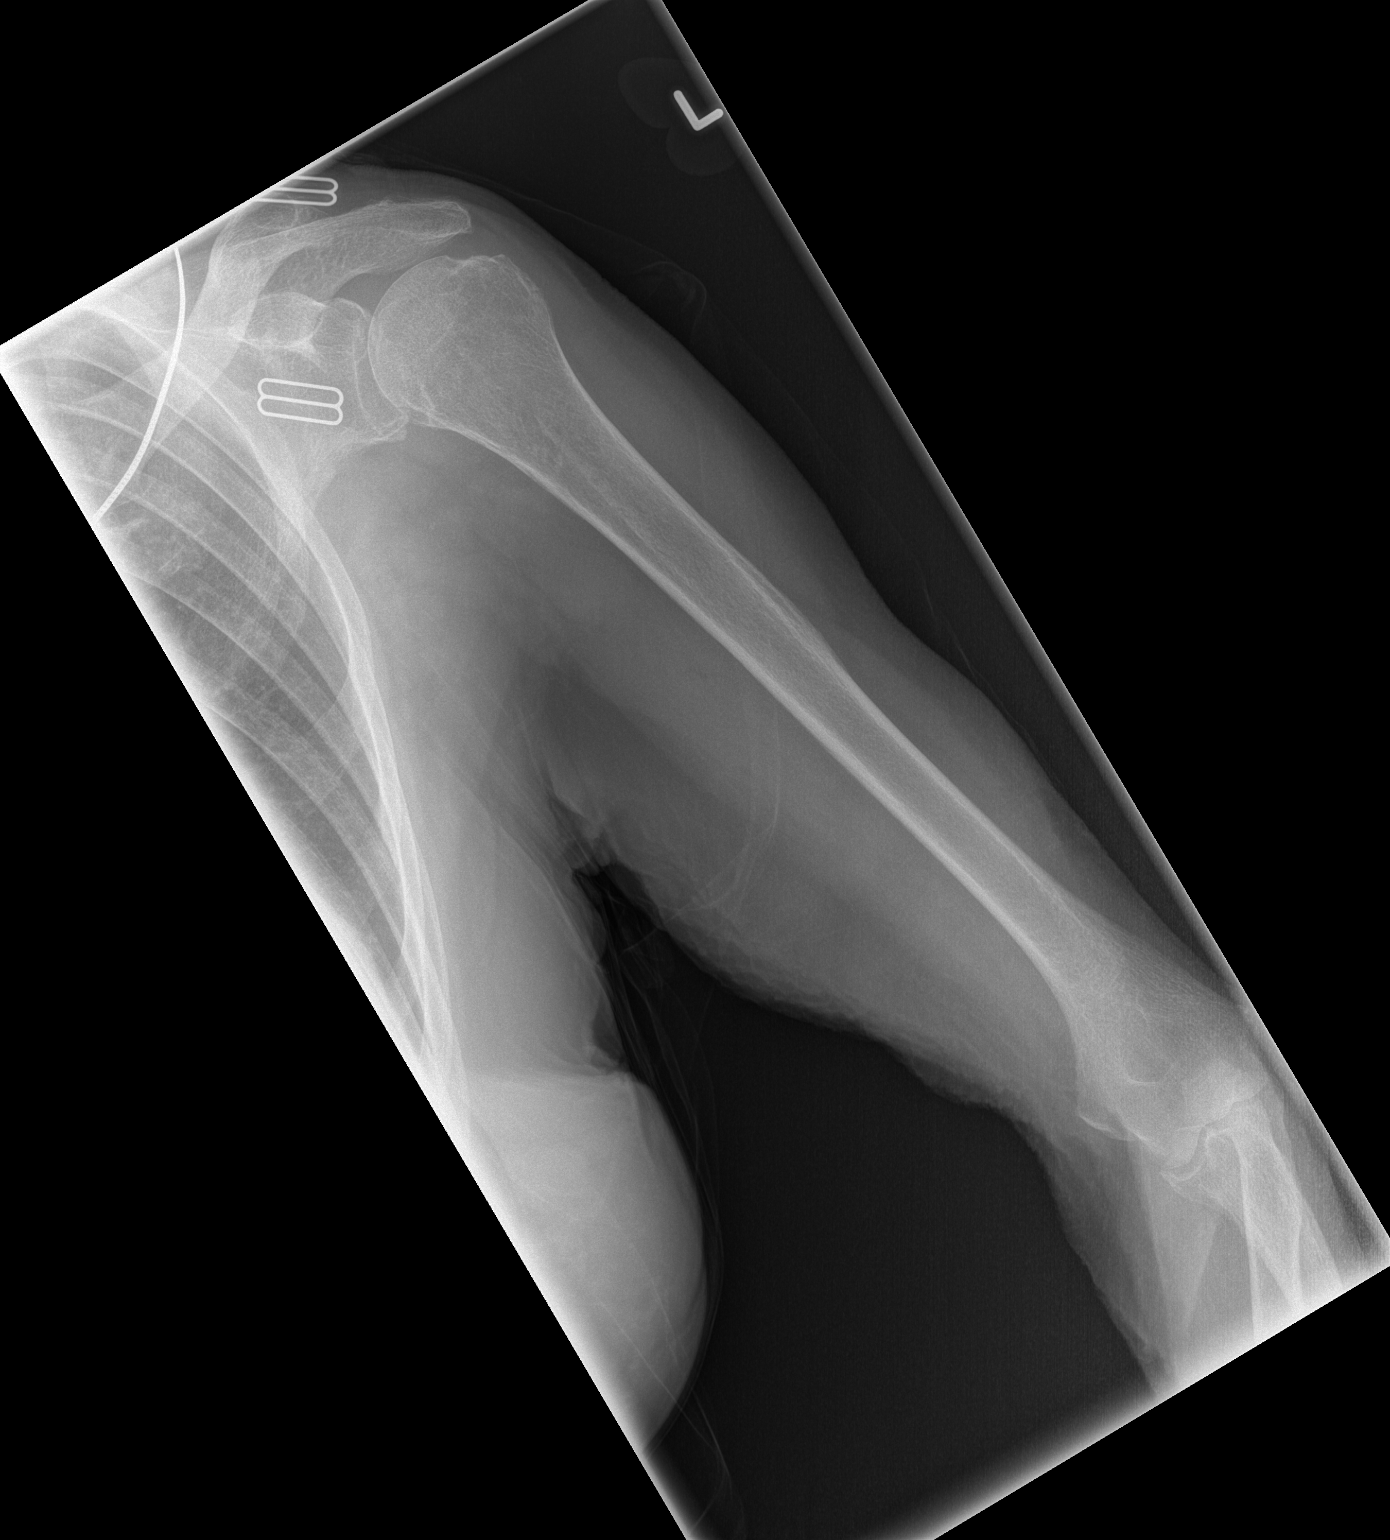

[w humerus lat left *]
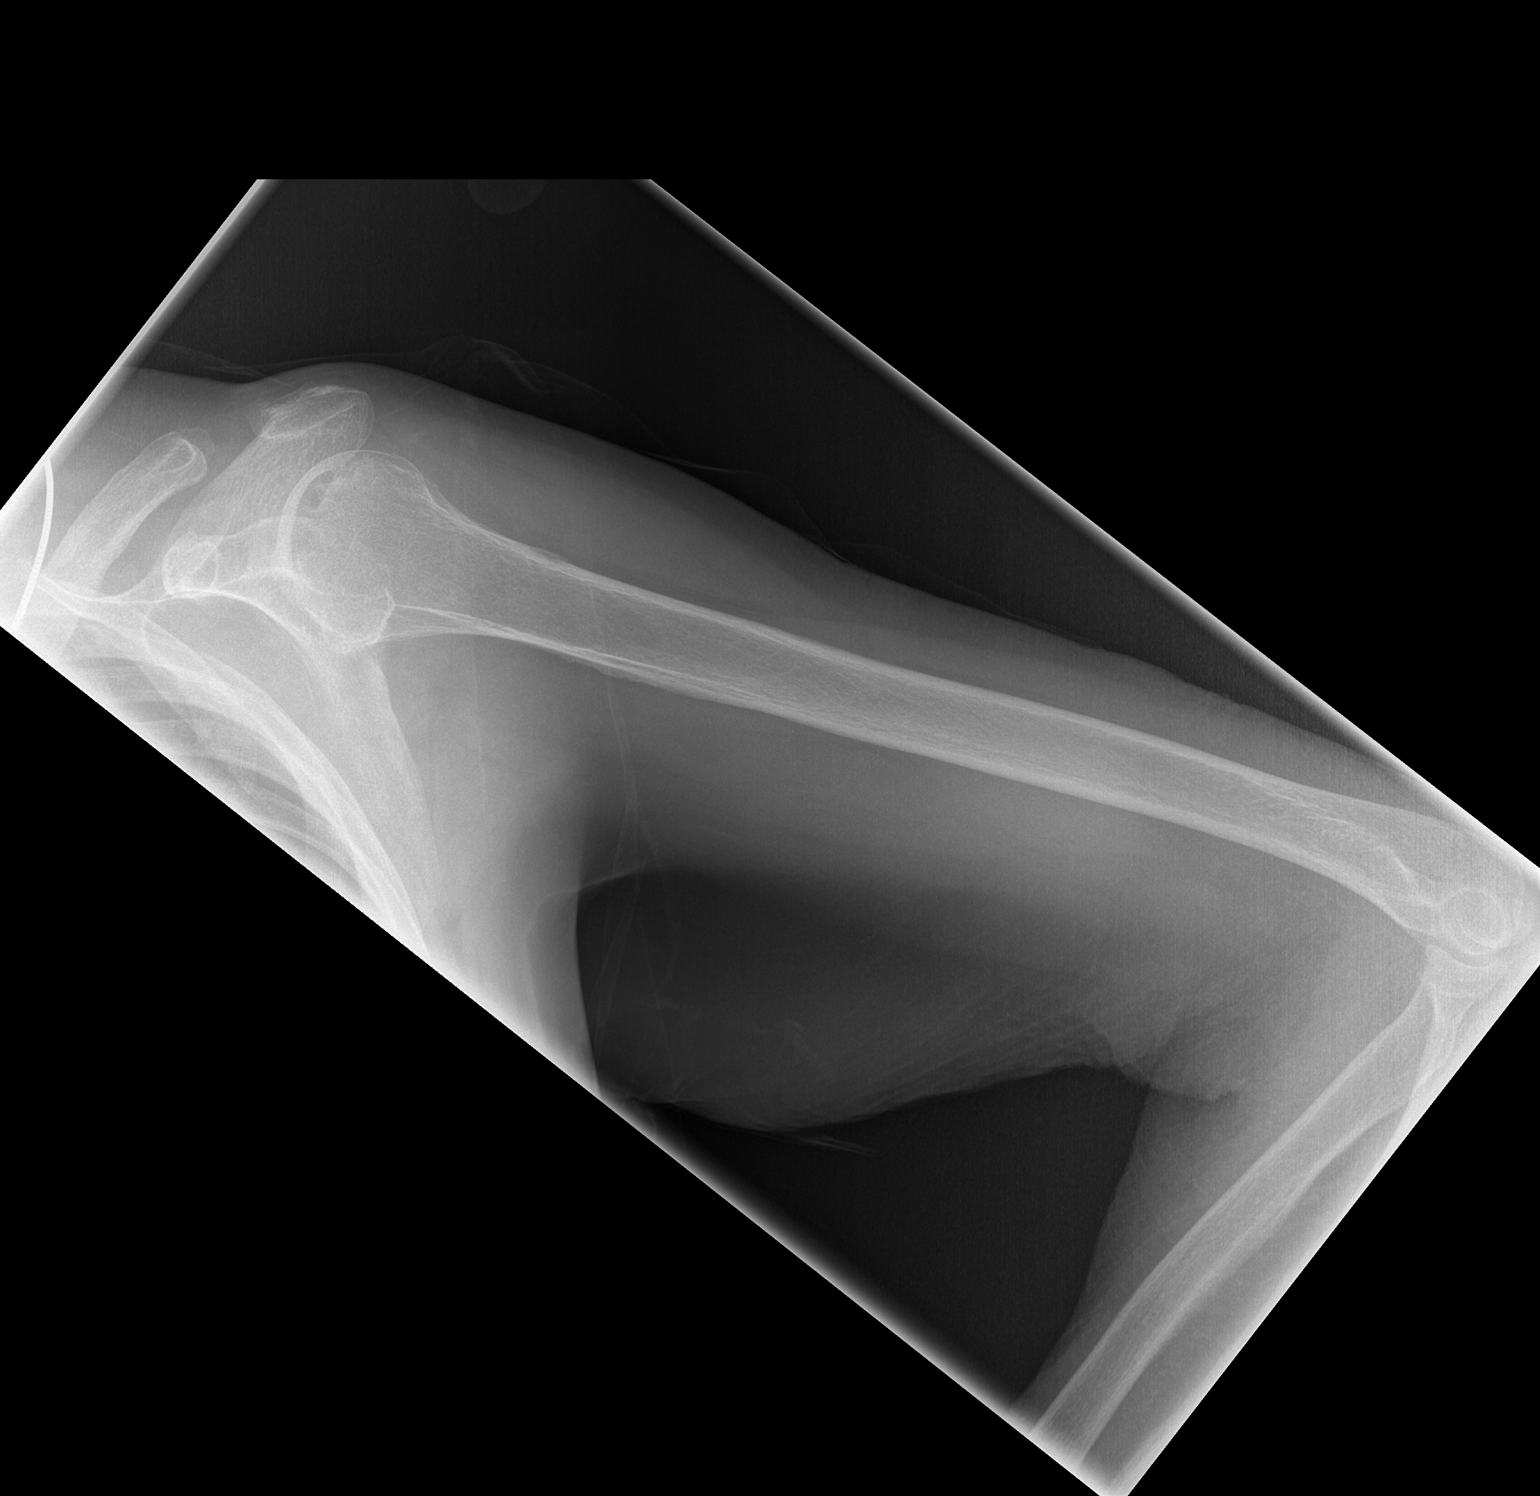

[2 of 2 positions shown; findings below may reference images not displayed]

FINDINGS: There is no fracture or dislocation. Mild cystic degenerative
changes in the greater tuberosity of the proximal humerus.
IMPRESSION: No significant abnormality.

## 2016-12-20 ENCOUNTER — Encounter (INDEPENDENT_AMBULATORY_CARE_PROVIDER_SITE_OTHER): Payer: Medicare Other | Admitting: Ophthalmology

## 2016-12-20 DIAGNOSIS — H43813 Vitreous degeneration, bilateral: Secondary | ICD-10-CM | POA: Diagnosis not present

## 2016-12-20 DIAGNOSIS — H353132 Nonexudative age-related macular degeneration, bilateral, intermediate dry stage: Secondary | ICD-10-CM | POA: Diagnosis not present

## 2020-04-20 ENCOUNTER — Non-Acute Institutional Stay (SKILLED_NURSING_FACILITY): Payer: Medicare Other | Admitting: Internal Medicine

## 2020-04-20 ENCOUNTER — Encounter: Payer: Self-pay | Admitting: Internal Medicine

## 2020-04-20 DIAGNOSIS — F0391 Unspecified dementia with behavioral disturbance: Secondary | ICD-10-CM

## 2020-04-20 DIAGNOSIS — D649 Anemia, unspecified: Secondary | ICD-10-CM | POA: Diagnosis not present

## 2020-04-20 DIAGNOSIS — N390 Urinary tract infection, site not specified: Secondary | ICD-10-CM | POA: Diagnosis not present

## 2020-04-20 DIAGNOSIS — G629 Polyneuropathy, unspecified: Secondary | ICD-10-CM

## 2020-04-20 DIAGNOSIS — S7292XD Unspecified fracture of left femur, subsequent encounter for closed fracture with routine healing: Secondary | ICD-10-CM

## 2020-04-20 NOTE — Progress Notes (Signed)
Location:    [facility] Room Number: 506/P Place of Service:  SNF (31) Provider:  Lorne Skeens  No primary care provider on file.  No care team member to display  Extended Emergency Contact Information Primary Emergency Contact: Oriley,James Address: [address]          [place], [place] [address] Johnnette Litter of [place] Phone: [phone] Mobile Phone: [phone] Relation: Son Secondary Emergency Contact: [place] Mobile Phone: [phone] Relation: Son  Code Status:  DNR Goals of care: Advanced Directive information Advanced Directives 04/20/2020  Does Patient Have a Medical Advance Directive? Yes  Type of Advance Directive Out of facility DNR (pink MOST or yellow form)  Does patient want to make changes to medical advance directive? No - Patient declined  Copy of [place] in Chart? -  Would patient like information on creating a medical advance directive? -     Chief Complaint  Patient presents with  . Hospitalization Follow-up    Hospital follow to [facility]  Status post hospitalization for left femoral condyle fracture status post repair  HPI:  Pt is a 86 y.o. female seen today for a hospital f/u status post admission for a left femoral condyle fracture status post repair..  She sustained a fracture after a fall at home and underwent operative repair on April 15, 2020.  She is nonweightbearing on her left lower extremity for now and physical therapy was recommended.  Patient also has a history of anemia preop hemoglobin was 7.3 so she received 2 units of packed red blood cells and her hemoglobin did go up to 9.8.  Postop she appears to be tolerating the Lovenox for DVT prophylaxis without any evidence of bleeding and her hemoglobin apparently has been stable it was 7.6 on June 8.  There are recommendations to monitor this frequently.  She was also treated for a UTI with IV Rocephin secondary to E.  coli.  She was transitioned to [facility] which she will be on for approximately 9 additional days.  She also has significant dementia and will need extensive assistance.  She did have a Foley catheter but this has been removed and apparently she is voiding well.  She also apparently has some agitation which resolved.  He is on Lovenox for DVT prophylaxis for 4 weeks.  Currently she is sitting in her chair comfortably she is very pleasant and cooperative with exam-vital signs appear to be stable    Past Medical History:  Diagnosis Date  . Anxiety   . Cancer ([place])   . Depression   . Neuropathic pain    Past Surgical History:  Procedure Laterality Date  . ABDOMINAL HYSTERECTOMY    . APPENDECTOMY    . CHOLECYSTECTOMY    . TONSILLECTOMY      Allergies  Allergen Reactions  . Other Other (See Comments)    Erythema Erythema  Renal insufficiency Renal insufficiency  Erythema Erythema   . Sulfa Antibiotics   . Donepezil Nausea Only  . Pregabalin Nausea Only    Allergies as of 04/20/2020      Reactions   Other Other (See Comments)   Erythema Erythema Renal insufficiency Renal insufficiency Erythema Erythema   Sulfa Antibiotics    Donepezil Nausea Only   Pregabalin Nausea Only      Medication List       Accurate as of April 20, 2020 11:59 PM. If you have any questions, ask your nurse or doctor.  STOP taking these medications   aspirin EC 325 MG tablet Stopped by: Granville Lewis, PA-C   fentaNYL 75 MCG/HR Commonly known as: [place] by: Granville Lewis, PA-C   omeprazole 20 MG capsule Commonly known as: PRILOSEC Stopped by: Granville Lewis, PA-C     TAKE these medications   acetaminophen 325 MG tablet Commonly known as: TYLENOL Take 650 mg by mouth every 6 (six) hours as needed for mild pain or fever.   CALCIUM 500/VITAMIN D PO Take 1 tablet by mouth daily. FOR VITAMIN D DEFICIENCY   Calcium Oyster Shell 500 MG Tabs Take 1 tablet by mouth  daily.   CEFDINIR PO Take 1 capsule by mouth 2 (two) times daily.   cyanocobalamin 1000 MCG tablet Take 1,000 mcg by mouth daily.   enoxaparin 30 MG/0.3ML injection Commonly known as: LOVENOX Inject 30 mg into the skin. INTO THE SKIN DAILY AT 6 PM FOR X 4 WEEKS   gabapentin 400 MG capsule Commonly known as: NEURONTIN Take 400 mg by mouth 3 (three) times daily. Total of 5 capsules per day   ICAPS AREDS 2 PO Take 1 capsule by mouth daily.   lidocaine 5 % ointment Commonly known as: XYLOCAINE Apply 1 application topically as needed.   melatonin 3 MG Tabs tablet Take 3 mg by mouth at bedtime. FOR INSOMNIA   NON FORMULARY DIET: REGULAR   oxyCODONE 5 MG immediate release tablet Commonly known as: Oxy IR/ROXICODONE Take 5 mg by mouth every 6 (six) hours as needed for severe pain.   polyvinyl alcohol 1.4 % ophthalmic solution Commonly known as: LIQUIFILM TEARS Place 1 drop into both eyes 3 (three) times daily as needed for dry eyes.   sertraline 100 MG tablet Commonly known as: ZOLOFT Take 100 mg by mouth daily.   Vitamin D (Cholecalciferol) 25 MCG (1000 UT) Tabs Take 1 tablet by mouth daily.       Review of Systems   This is somewhat limited since patient is a poor historian-nursing does not report any issues.  In general she is not complaining of any fever or chills.  Skin does not complain of rashes itching or diaphoresis.  Head ears eyes nose mouth and throat is not complaining of visual changes or sore throat.  Respiratory does not complain of being short of breath or having a cough.  Cardiac no complaints of chest pain or edema.  GI is not complaining of abdominal discomfort nausea vomiting diarrhea constipation.  GU no complaints of dysuria nursing does not report any issues.  Musculoskeletal is status post surgery left lower extremity but is not really complaining of any pain at this time she is sitting comfortably.  Neurologic does not complain of  dizziness headache numbness syncope.  And psych does have dementia but appears to be quite pleasant appears to be in good spirits does not complain of being depressed I do not see evidence of anxiety at this time.    Immunization History  Administered Date(s) Administered  . Influenza, High Dose Seasonal PF 09/28/2014, 09/28/2014, 09/02/2015, 09/02/2015, 11/18/2018, 09/16/2019  . Influenza,inj,Quad PF,6+ Mos 09/08/2013  . Influenza-Unspecified 09/08/2013, 08/24/2016, 08/24/2016, 09/05/2017  . Moderna SARS-COVID-2 Vaccination 01/27/2020, 02/24/2020  . Pneumococcal Conjugate-13 02/28/2015, 02/28/2015  . Pneumococcal Polysaccharide-23 11/12/1994, 11/12/1994, 09/16/2019  . Tdap 12/23/2014   Pertinent  Health Maintenance Due  Topic Date Due  . DEXA SCAN  Never done  . INFLUENZA VACCINE  06/12/2020  . PNA vac Low Risk Adult  Completed   No flowsheet  data found. Functional Status Survey:    Vitals:   04/20/20 1137  BP: 136/68  Pulse: 78  Resp: 18  Temp: 98.2 F (36.8 C)  TempSrc: Oral  SpO2: 99%  Weight: 140 lb (63.5 kg)  Height: 5\' 3"  (1.6 m)   Body mass index is 24.8 kg/m. Physical Exam   In general this is a pleasant somewhat confused elderly female no distress sitting comfortably in her chair.  Her skin is warm and dry.  Eyes she has prescription lenses-sclera and conjunctive are clear visual acuity appears grossly intact.  Oropharynx is clear mucous membranes moist.  Chest is clear to auscultation there is no labored breathing.  Heart is regular rate and rhythm with some irregular beats she does not have significant lower extremity edema pedal pulses appear to be intact.  Abdomen is soft nontender with positive bowel sounds.  Musculoskeletal she does have a patch over the surgical site is able to move her other 3 extremities it appears at baseline with some postop weakness of her left lower extremity.  Neurologic appears grossly intact cannot appreciate  lateralizing findings her speech is clear.  Psych she is oriented to self she is pleasant does follow simple verbal commands.    Labs reviewed: April 19, 2020.  WBC 11.5 hemoglobin 7.6 platelets 148.  Sodium 137 potassium 3.7 BUN 34 creatinine 1.23.  Albumin 3.7 otherwise liver function tests within normal limits.     No results found for: TSH No results found for: HGBA1C No results found for: CHOL, HDL, LDLCALC, LDLDIRECT, TRIG, CHOLHDL  Significant Diagnostic Results in last 30 days:  DG Elbow Complete Left  Result Date: 12/23/2014 CLINICAL DATA:  Elbow pain secondary to a fall today. EXAM: LEFT ELBOW - COMPLETE 3+ VIEW COMPARISON:  None. FINDINGS: There is no fracture or dislocation or joint effusion. Minimal arthritic changes. IMPRESSION: No significant abnormality. Electronically Signed   By: Lorriane Shire M.D.   On: 12/23/2014 10:14   DG HumerUS Left  Result Date: 12/23/2014 CLINICAL DATA:  Left upper arm and elbow pain secondary to a fall today. EXAM: LEFT HUMERUS - 2+ VIEW COMPARISON:  None. FINDINGS: There is no fracture or dislocation. Mild cystic degenerative changes in the greater tuberosity of the proximal humerus. IMPRESSION: No significant abnormality. Electronically Signed   By: Lorriane Shire M.D.   On: 12/23/2014 10:14    Assessment/Plan  #1 history of left femoral condyle fracture-status post repair-she appears to have done well with this she is nonweightbearing on left lower extremity and will need followed by orthopedics-will need continued PT and OT.  She is on Lovenox short recommendation to be on this for 30 days-.  For pain she does have orders for as needed Tylenol as well as as needed cocci IR 5 mg every 6 hours as needed-at this point appears to be stable.  She does continue on Os-Cal with vitamin D as well  2.  History of anemia this was actually preop she did receive a transfusion and hemoglobin went up to 9.8-postop hemoglobin appears to be in  the mid sevens-it was 7.6 on lab done on June 8-there are orders to update this we will do that tomorrow as well as periodically during her stay here.  Clinically she appears to be doing well in this regards.  3.  History of UTI E. coli she is completing a course of Omnicef was treated with Rocephin this was E. coli she is not complaining of any dysuria-her Foley has been  removed before discharge at this point will monitor.  4.  History of dementia with agitation-at this point will monitor and continue supportive care she is very pleasant today during exam-there may be an element of sundowning at this point will monitor.  5.  History of neuropathy she continues on Neurontin 400 mg 3 times daily this appears to be stable.  6.  History of depression she continues on Zoloft 100 mg a day again she appears to be in good spirits she is quite pleasant does not appear to be depressed.  7.  History of vitamin D deficiency she continues on supplementation.     Again will update a CBC tomorrow also will update a metabolic panel next week.  [id number] note greater than 35 minutes spent assessing patient reviewing her chart and labs and coordinating and formulating a plan of care for numerous diagnoses-of note greater than 50% of time spent coordinating a plan of care with input as noted above

## 2020-04-21 LAB — CBC AND DIFFERENTIAL
HCT: 27 — AB (ref 36–46)
Hemoglobin: 8.2 — AB (ref 12.0–16.0)
Platelets: 229 (ref 150–399)
WBC: 10.1

## 2020-04-21 LAB — CBC: RBC: 3.29 — AB (ref 3.87–5.11)

## 2020-04-22 ENCOUNTER — Encounter: Payer: Self-pay | Admitting: Internal Medicine

## 2020-04-22 ENCOUNTER — Non-Acute Institutional Stay (SKILLED_NURSING_FACILITY): Payer: Medicare Other | Admitting: Internal Medicine

## 2020-04-22 DIAGNOSIS — N1832 Chronic kidney disease, stage 3b: Secondary | ICD-10-CM

## 2020-04-22 DIAGNOSIS — R54 Age-related physical debility: Secondary | ICD-10-CM

## 2020-04-22 DIAGNOSIS — M961 Postlaminectomy syndrome, not elsewhere classified: Secondary | ICD-10-CM

## 2020-04-22 DIAGNOSIS — S7292XD Unspecified fracture of left femur, subsequent encounter for closed fracture with routine healing: Secondary | ICD-10-CM

## 2020-04-22 DIAGNOSIS — F0391 Unspecified dementia with behavioral disturbance: Secondary | ICD-10-CM

## 2020-04-22 DIAGNOSIS — Z66 Do not resuscitate: Secondary | ICD-10-CM

## 2020-04-22 DIAGNOSIS — M810 Age-related osteoporosis without current pathological fracture: Secondary | ICD-10-CM

## 2020-04-22 DIAGNOSIS — E538 Deficiency of other specified B group vitamins: Secondary | ICD-10-CM

## 2020-04-22 DIAGNOSIS — D62 Acute posthemorrhagic anemia: Secondary | ICD-10-CM

## 2020-04-22 DIAGNOSIS — N390 Urinary tract infection, site not specified: Secondary | ICD-10-CM

## 2020-04-22 MED ORDER — OXYCODONE HCL 5 MG PO TABS: 5.0000 mg | ORAL_TABLET | Freq: Every day | ORAL | 0 refills | Status: DC

## 2020-04-22 NOTE — Progress Notes (Signed)
Provider:  Rexene Edison. Mariea Clonts, D.O., C.M.D. Location:  [facility] Room Number: [place] of Service:  SNF (31)  PCP: Jonathon Bellows, PA-C Patient Care Team: Jonathon Bellows, PA-C as PCP - General (Physician Assistant) Re[facility], [place] [facility] Kelvin Cellar, MD as Referring Physician (Orthopedic Surgery) [place] [facility] Contact Information Primary Emergency Contact: Chouinard,James Address: [address]          [place], [place] [address] Johnnette Litter of [place] Phone: [phone] Mobile Phone: [phone] Relation: Son Secondary Emergency Contact: [place] Mobile Phone: [phone] Relation: Son  Code Status: DNR on file at [place]: Advanced Directive information Advanced Directives 04/28/2020  Does Patient Have a Medical Advance Directive? Yes  Type of Advance Directive Out of facility DNR (pink MOST or yellow form)  Does patient want to make changes to medical advance directive? No - Patient declined  Copy of [place] in Chart? -  Would patient like information on creating a medical advance directive? -   Chief Complaint  Patient presents with  . New Admit To SNF    New Admit to SNF     HPI: Patient is a 86 y.o. female with h/o moderate dementia living alone (with cameras in each room for son to monitor), chronic pain related to postlaminectomy syndrome, sacroiliac pain followed by [facility], breast cancer, diverticulosis, anemia with baseline hgb 9, hearing loss, CKD3b, secondary hyperparathyroidism, osteoporosis, blood clots, osteoarthritis with left knee effusion, and recently decreasing ADL function, weight loss and malnutrition seen today for admission to [facility] s/p hospitalization from 6/4-04/19/20 at [facility] with fall at home when she sustained a closed left oblique  fracture of her femoral condyle.  She fell in her bathroom.  She underwent surgery on 04/15/20 with Dr. Gerarda Fraction.  She did receive 2 units PRBCs and lovenox DVT prophylaxis (4 wk course).  She was treated for a UTI with IV rocephin and transitioned to po abx (omnicef for 9 more days).  She is nonweightbearing to her left lower extremity.  She had low vitamin D and this is being repleted.  She is for f/u cbc, bmp and vitamin D in 8 wks.  She is to see Dr. Gerarda Fraction in f/u in 8-10 days.  She's had both moderna covid vaccines 3/17 and 4/14.  Family informs me that she used to be on B12 injections which were stopped at some point.    GFR 46 when calculated due to gabapentin dosing.    Met with pt, son and daughter and all indicated pt does not always sleep well at night.  Had been getting tylenol PM which I advised against and they were ok with retrying melatonin here, but she tends to have a bad night and then sleep well for several, and then it repeats itself. Family is supportive of the idea of long-term care for her safety at this point.  Past Medical History:  Diagnosis Date  . Anxiety   . Breast cancer [facility]) 2010   Right s/p right breast mastectomy  . Cancer ([place])   . Depression   . H/O blood clots   . Neuropathic pain    Past Surgical History:  Procedure Laterality Date  . ABDOMINAL HYSTERECTOMY    . APPENDECTOMY    . BLADDER SURGERY    . BREAST SURGERY    . CHOLECYSTECTOMY    .  GALLBLADDER SURGERY    . KNEE SURGERY    . MASTECTOMY    . TONSILLECTOMY      Social History   Socioeconomic History  . Marital status: Widowed    Spouse name: Not on file  . Number of children: Not on file  . Years of education: Not on file  . Highest education level: Not on file  Occupational History  . Occupation: Retired  Tobacco Use  . Smoking status: Never Smoker  . Smokeless tobacco: Never Used  Vaping Use  . Vaping Use: Never used  Substance and Sexual Activity  . Alcohol use: Never  .  Drug use: Never  . Sexual activity: Not Currently  Other Topics Concern  . Not on file  Social History Narrative  . Not on file   Social Determinants of Health   Financial Resource Strain:   . Difficulty of Paying Living Expenses:   Food Insecurity:   . Worried About Charity fundraiser in the Last Year:   . Arboriculturist in the Last Year:   Transportation Needs:   . Film/video editor (Medical):   Marland Kitchen Lack of Transportation (Non-Medical):   Physical Activity:   . Days of Exercise per Week:   . Minutes of Exercise per Session:   Stress:   . Feeling of Stress :   Social Connections:   . Frequency of Communication with Friends and Family:   . Frequency of Social Gatherings with Friends and Family:   . Attends Religious Services:   . Active Member of Clubs or Organizations:   . Attends Archivist Meetings:   Marland Kitchen Marital Status:     reports that she has never smoked. She has never used smokeless tobacco. She reports that she does not drink alcohol and does not use drugs.  Functional Status Survey:    Family History  Family history unknown: Yes    Health Maintenance  Topic Date Due  . DEXA SCAN  Never done  . INFLUENZA VACCINE  06/12/2020  . TETANUS/TDAP  12/23/2024  . COVID-19 Vaccine  Completed  . PNA vac Low Risk Adult  Completed    Allergies  Allergen Reactions  . Nsaids   . Other Other (See Comments)    Erythema Erythema  Renal insufficiency Renal insufficiency  Erythema Erythema   . Sulfa Antibiotics   . Donepezil Nausea Only  . Pregabalin Nausea Only    Outpatient Encounter Medications as of 04/22/2020  Medication Sig  . acetaminophen (TYLENOL) 325 MG tablet Take 650 mg by mouth every 6 (six) hours as needed for mild pain or fever.  . Calcium Carbonate-Vitamin D (CALCIUM 500/VITAMIN D PO) Take 1 tablet by mouth daily. FOR VITAMIN D DEFICIENCY  . Calcium Oyster Shell 500 MG TABS Take 1 tablet by mouth daily.  . [EXPIRED] CEFDINIR PO  Take 300 mg by mouth 2 (two) times daily at 10 AM and 5 PM.   . cyanocobalamin 1000 MCG tablet Take 1,000 mcg by mouth daily.  Marland Kitchen enoxaparin (LOVENOX) 30 MG/0.3ML injection Inject 30 mg into the skin. INTO THE SKIN DAILY AT 6 PM FOR X 4 WEEKS  . gabapentin (NEURONTIN) 400 MG capsule Take 400 mg by mouth 2 (two) times daily. Total of 5 capsules per day   . lidocaine (XYLOCAINE) 5 % ointment Apply 1 application topically as needed.  . melatonin 3 MG TABS tablet Take 3 mg by mouth at bedtime. FOR INSOMNIA  . Multiple Vitamins-Minerals (ICAPS AREDS  2 PO) Take 1 capsule by mouth daily.  . NON FORMULARY DIET: REGULAR  . [EXPIRED] oxyCODONE (OXY IR/ROXICODONE) 5 MG immediate release tablet Take 5 mg by mouth every 6 (six) hours as needed for severe pain.  . polyvinyl alcohol (LIQUIFILM TEARS) 1.4 % ophthalmic solution Place 1 drop into both eyes 3 (three) times daily as needed for dry eyes.  Marland Kitchen sertraline (ZOLOFT) 100 MG tablet Take 100 mg by mouth daily.  . Vitamin D, Cholecalciferol, 25 MCG (1000 UT) TABS Take 1 tablet by mouth daily.  Marland Kitchen oxyCODONE (ROXICODONE) 5 MG immediate release tablet Take 1 tablet (5 mg total) by mouth at bedtime for 14 days.   No facility-administered encounter medications on file as of 04/22/2020.    Review of Systems  Reason unable to perform ROS: from family, nursing as pt too lethargic to participate--still a bit delirious.  Constitutional: Positive for malaise/fatigue. Negative for chills and fever.  HENT: Positive for hearing loss.   Eyes: Negative for blurred vision.  Respiratory: Negative for shortness of breath.   Cardiovascular: Negative for chest pain, palpitations and leg swelling.  Gastrointestinal: Negative for abdominal pain and constipation.  Genitourinary: Negative for dysuria.  Musculoskeletal: Positive for falls and joint pain.  Neurological: Negative for dizziness and loss of consciousness.  Endo/Heme/Allergies: Bruises/bleeds easily.   Psychiatric/Behavioral: Positive for depression and memory loss. The patient has insomnia. The patient is not nervous/anxious.     Vitals:   04/22/20 1112  BP: 136/79  Pulse: 88  SpO2: 92%  Weight: 140 lb (63.5 kg)  Height: '5\' 3"'$  (1.6 m)   Body mass index is 24.8 kg/m. Physical Exam Vitals reviewed.  Constitutional:      General: She is not in acute distress.    Appearance: She is not toxic-appearing.     Comments: Lethargic and wakes up briefly and goes back off to sleep, not talking much  HENT:     Head: Normocephalic and atraumatic.     Right Ear: External ear normal.     Left Ear: External ear normal.     Ears:     Comments: HOH    Mouth/Throat:     Mouth: Mucous membranes are dry.  Eyes:     Extraocular Movements: Extraocular movements intact.     Conjunctiva/sclera: Conjunctivae normal.     Pupils: Pupils are equal, round, and reactive to light.  Cardiovascular:     Rate and Rhythm: Normal rate and regular rhythm.     Heart sounds: No murmur heard.   Pulmonary:     Effort: Pulmonary effort is normal.     Breath sounds: Normal breath sounds. No wheezing, rhonchi or rales.  Abdominal:     General: Bowel sounds are normal. There is no distension.     Palpations: Abdomen is soft. There is no mass.     Tenderness: There is no abdominal tenderness. There is no guarding or rebound.  Musculoskeletal:     Cervical back: Neck supple.     Comments: Not following commands to move extremities  Skin:    Comments: Ecchymoses around intact hydrocolloid dressings; no drainage outside of that, erythema or warmth, generalized swelling; also ecchymoses on abdomen from lovenox  Neurological:     Cranial Nerves: No cranial nerve deficit.     Motor: Weakness present.     Gait: Gait abnormal.     Comments: Notes indicate she'd been back to baseline, but is now drowsy after being up much of last night (until 5am)  Psychiatric:        Mood and Affect: Mood normal.     Labs  reviewed: Basic Metabolic Panel: Recent Labs    04/28/20 0000  NA 136*  K 4.4  CL 105  CO2 20  BUN 22*  CREATININE 1.3*  CALCIUM 8.6*   Liver Function Tests: No results for input(s): AST, ALT, ALKPHOS, BILITOT, PROT, ALBUMIN in the last 8760 hours. No results for input(s): LIPASE, AMYLASE in the last 8760 hours. No results for input(s): AMMONIA in the last 8760 hours. CBC: Recent Labs    04/21/20 0000  WBC 10.1  HGB 8.2*  HCT 27*  PLT 229   Cardiac Enzymes: No results for input(s): CKTOTAL, CKMB, CKMBINDEX, TROPONINI in the last 8760 hours. BNP: Invalid input(s): POCBNP No results found for: HGBA1C No results found for: TSH Lab Results  Component Value Date   VITAMINB12 896 04/28/2020   No results found for: FOLATE No results found for: IRON, TIBC, FERRITIN  hgb 7.6 on 6/8 and cr 1.23  Imaging and Procedures obtained prior to SNF admission: DG Elbow Complete Left  Result Date: 12/23/2014 CLINICAL DATA:  Elbow pain secondary to a fall today. EXAM: LEFT ELBOW - COMPLETE 3+ VIEW COMPARISON:  None. FINDINGS: There is no fracture or dislocation or joint effusion. Minimal arthritic changes. IMPRESSION: No significant abnormality. Electronically Signed   By: Lorriane Shire M.D.   On: 12/23/2014 10:14   DG HumerUS Left  Result Date: 12/23/2014 CLINICAL DATA:  Left upper arm and elbow pain secondary to a fall today. EXAM: LEFT HUMERUS - 2+ VIEW COMPARISON:  None. FINDINGS: There is no fracture or dislocation. Mild cystic degenerative changes in the greater tuberosity of the proximal humerus. IMPRESSION: No significant abnormality. Electronically Signed   By: Lorriane Shire M.D.   On: 12/23/2014 10:14    Assessment/Plan 1. Closed fracture of left femur with routine healing, unspecified fracture morphology, unspecified portion of femur, subsequent encounter -opted to schedule hs oxycodone for 14 days and continue q6h prn dosing for that long, then wean if possible -  oxyCODONE (ROXICODONE) 5 MG immediate release tablet; Take 1 tablet (5 mg total) by mouth at bedtime for 14 days.  Dispense: 14 tablet; Refill: 0 -reduce gabapentin due to GFR to '400mg'$  po bid  -discussed changes with family who were agreeable--apparently attempts to wean meds for her pain have been quite challenging in the past -nonweightbearing, here for PT, OT -f/u with ortho as planned for f/u xrays and next steps   2. Dementia with behavioral disturbance, unspecified dementia type ([place]) -not on dementia meds -does have zoloft for depression  3. Chronic kidney disease, stage 3b -Avoid nephrotoxic agents like nsaids, dose adjust renally excreted meds, hydrate. -adjust gabapentin accordingly  4. Postlaminectomy syndrome of lumbosacral region -with chronic pain, to f/u with pain mgt outpatient  5. Frailty syndrome in geriatric patient -weight loss, failure to thrive, increased adl needs with progressing dementia, in need of long-term care for her moderate dementia -requiring help with all adls at this point since fx  6. Postoperative anemia due to acute blood loss -had transfusion and latest hgb 7.6 6/8  7. Senile osteoporosis -continue ca with D and f/u D3 level as planned for 8 wks  8. DNR (do not resuscitate) - Do not attempt resuscitation (DNR)  9.  B12 deficiency:  Check on level due to h/o using IM injections and recently being on oral to see if absorbing b/c family thought she'd had pernicious anemia  10.  UTI:  Completing course of omnicef -had foley perioperatively and likely not hydrating well on her own with recent declining po intake overall -push fluids here  Family/ staff Communication: discussed with son and daughter in room  Labs/tests ordered:  Cbc, bmp done already, vitamin D at 8 wks, B12 next draw  [place] L. Kimbrely Buckel, D.O. [facility] [address], [place] [address] Cell Phone (Mon-Fri 8am-5pm):   [phone] On Call:  [phone] & follow prompts after 5pm & weekends Office Phone:  [phone] Office Fax:  [phone]

## 2020-04-28 ENCOUNTER — Non-Acute Institutional Stay (SKILLED_NURSING_FACILITY): Payer: Medicare Other | Admitting: Internal Medicine

## 2020-04-28 ENCOUNTER — Encounter: Payer: Self-pay | Admitting: Internal Medicine

## 2020-04-28 DIAGNOSIS — S7292XD Unspecified fracture of left femur, subsequent encounter for closed fracture with routine healing: Secondary | ICD-10-CM | POA: Diagnosis not present

## 2020-04-28 DIAGNOSIS — D62 Acute posthemorrhagic anemia: Secondary | ICD-10-CM | POA: Diagnosis not present

## 2020-04-28 DIAGNOSIS — F0391 Unspecified dementia with behavioral disturbance: Secondary | ICD-10-CM

## 2020-04-28 DIAGNOSIS — M25531 Pain in right wrist: Secondary | ICD-10-CM | POA: Diagnosis not present

## 2020-04-28 LAB — BASIC METABOLIC PANEL
BUN: 22 — AB (ref 4–21)
CO2: 20 (ref 13–22)
Chloride: 105 (ref 99–108)
Creatinine: 1.3 — AB (ref 0.5–1.1)
Glucose: 81
Potassium: 4.4 (ref 3.4–5.3)
Sodium: 136 — AB (ref 137–147)

## 2020-04-28 LAB — COMPREHENSIVE METABOLIC PANEL
Calcium: 8.6 — AB (ref 8.7–10.7)
GFR calc Af Amer: 40.84
GFR calc non Af Amer: 35.24

## 2020-04-28 LAB — VITAMIN B12: Vitamin B-12: 896

## 2020-04-28 NOTE — Progress Notes (Signed)
Location:    [facility] [facility] Room Number: 506/P Place of Service:  SNF [phone]) Provider:  [place]ville Lewis  No primary care provider on file.  No care team member to display  Extended Emergency Contact Information Primary Emergency Contact: Bey,James Address: [address]          [place], [place] [address] Johnnette Litter of [place] Phone: [phone] Mobile Phone: [phone] Relation: Son Secondary Emergency Contact: [place] Mobile Phone: [phone] Relation: Son  Code Status:  DNR Goals of care: Advanced Directive information Advanced Directives 04/28/2020  Does Patient Have a Medical Advance Directive? Yes  Type of Advance Directive Out of facility DNR (pink MOST or yellow form)  Does patient want to make changes to medical advance directive? No - Patient declined  Copy of [place] in Chart? -  Would patient like information on creating a medical advance directive? -     Chief Complaint  Patient presents with   Acute Visit    Right Wrist Swelling    HPI:  Pt is a 86 y.o. female seen today for an acute visit for increased right wrist edema with pain.  Patient was recently admitted here for short-term rehab after sustaining a left femoral condyle fracture this was surgically repaired she apparently did well with this-apparently she sustained a fracture after a fall at home.  Her preop hemoglobin was 7.3 she received 2 units packed red blood cells and hemoglobin went up to 9.8.  Down to 7.6 on discharge  p but is up at 8.2 on lab done on June 10.  She continues on Lovenox for DVT prophylaxis   She also was treated for E. coli UTI with Rocephin.  She was transitioned to [facility] on discharge she is not complaining of any dysuria.  Has been noted she has had some increased edema of her right wrist today she is also appears to be having some increased pain.  Per nursing there is been no recent history of a  fall-.  Currently she is sitting in her bed comfortably but with attempt move or flex her wrist she does have significant pain.         Past Medical History:  Diagnosis Date   Anxiety    Breast cancer ([place]) 2010   Right s/p right breast mastectomy   Cancer ([place])    Depression    H/O blood clots    Neuropathic pain    Past Surgical History:  Procedure Laterality Date   ABDOMINAL HYSTERECTOMY     APPENDECTOMY     BLADDER SURGERY     BREAST SURGERY     CHOLECYSTECTOMY     GALLBLADDER SURGERY     KNEE SURGERY     MASTECTOMY     TONSILLECTOMY      Allergies  Allergen Reactions   Nsaids    Other Other (See Comments)    Erythema Erythema  Renal insufficiency Renal insufficiency  Erythema Erythema    Sulfa Antibiotics    Donepezil Nausea Only   Pregabalin Nausea Only    Outpatient Encounter Medications as of 04/28/2020  Medication Sig   acetaminophen (TYLENOL) 325 MG tablet Take 650 mg by mouth every 6 (six) hours as needed for mild pain or fever.   Calcium Carbonate-Vitamin D (CALCIUM 500/VITAMIN D PO) Take 1 tablet by mouth daily. FOR VITAMIN D DEFICIENCY   Calcium Oyster Shell 500 MG TABS Take 1 tablet by mouth daily.   cyanocobalamin 1000 MCG  tablet Take 1,000 mcg by mouth daily.   enoxaparin (LOVENOX) 30 MG/0.3ML injection Inject 30 mg into the skin. INTO THE SKIN DAILY AT 6 PM FOR X 4 WEEKS   gabapentin (NEURONTIN) 400 MG capsule Take 400 mg by mouth 2 (two) times daily. Total of 5 capsules per day    lidocaine (XYLOCAINE) 5 % ointment Apply 1 application topically as needed.   melatonin 3 MG TABS tablet Take 3 mg by mouth at bedtime. FOR INSOMNIA   Multiple Vitamins-Minerals (ICAPS AREDS 2 PO) Take 1 capsule by mouth daily.   NON FORMULARY DIET: REGULAR   oxyCODONE (ROXICODONE) 5 MG immediate release tablet Take 1 tablet (5 mg total) by mouth at bedtime for 14 days.   polyvinyl alcohol (LIQUIFILM TEARS) 1.4 % ophthalmic  solution Place 1 drop into both eyes 3 (three) times daily as needed for dry eyes.   sertraline (ZOLOFT) 100 MG tablet Take 100 mg by mouth daily.   Vitamin D, Cholecalciferol, 25 MCG (1000 UT) TABS Take 1 tablet by mouth daily.   No facility-administered encounter medications on file as of 04/28/2020.    Review of Systems   This is somewhat limited secondary patient being somewhat of a poor historian with dementia but she is not really complaining of any fever chills dysuria shortness of breath cough.  Her pain mainly appears to be with her right wrist.    Immunization History  Administered Date(s) Administered   Influenza, High Dose Seasonal PF 09/28/2014, 09/28/2014, 09/02/2015, 09/02/2015, 11/18/2018, 09/16/2019   Influenza,inj,Quad PF,6+ Mos 09/08/2013   Influenza-Unspecified 09/08/2013, 08/24/2016, 08/24/2016, 09/05/2017   Moderna SARS-COVID-2 Vaccination 01/27/2020, 02/24/2020   Pneumococcal Conjugate-13 02/28/2015, 02/28/2015   Pneumococcal Polysaccharide-23 11/12/1994, 11/12/1994, 09/16/2019   Tdap 12/23/2014   Pertinent  Health Maintenance Due  Topic Date Due   DEXA SCAN  Never done   INFLUENZA VACCINE  06/12/2020   PNA vac Low Risk Adult  Completed   No flowsheet data found. Functional Status Survey:    Vitals:   04/28/20 1429  BP: (!) 113/50  Pulse: 65  Resp: 16  Temp: (!) 97.5 F (36.4 C)  TempSrc: Oral  Weight: 140 lb (63.5 kg)  Height: 5\' 3"  (1.6 m)   Body mass index is 24.8 kg/m. Physical Exam  In general this is a very pleasant elderly female no distress she appears to be comfortably lying in bed but appears to have some pain with that right wrist especially with movement.  Her skin is warm and dry.  Eyes she has prescription lenses sclera and conjunctive appear clear visual acuity appears grossly intact.  Oropharynx is clear mucous membranes moist.  Chest is clear to auscultation there is no labored breathing.  Heart is regular  rate and rhythm without murmur gallop or rub she does not have significant lower extremity edema.  Musculoskeletal does have surgical dressing over her left upper leg which is the site of the surgical site.  She does not appear to have significant lower extremity edema.  In regards to right arm-she appears to have appropriate movement of the arm as a whole--but the right wrist does have some increased erythema and tenderness-.  The area is tender to palpation and she has grimacing with attempts to flex and extend the wrist.  Her capillary refill is intact radial pulse is intact she does have strength of the right hand but secondary to pain grip strength is not as strong as the left hand  Neurologic appears grossly intact her speech  is clear cannot really appreciate lateralizing findings touch sensation is intact right hand and arm.  Psych she is oriented to self continues to be very pleasant smiling cooperative.      Labs reviewed:  April 21, 2020.  WBC 10.1 hemoglobin 8.2 platelets 229.  Sodium 136 potassium 4.4 BUN 22.1 creatinine 1.28.  B12 level 896.  Folate 10.0.   No results for input(s): NA, K, CL, CO2, GLUCOSE, BUN, CREATININE, CALCIUM, MG, PHOS in the last 8760 hours. No results for input(s): AST, ALT, ALKPHOS, BILITOT, PROT, ALBUMIN in the last 8760 hours. No results for input(s): WBC, NEUTROABS, HGB, HCT, MCV, PLT in the last 8760 hours. No results found for: TSH No results found for: HGBA1C No results found for: CHOL, HDL, LDLCALC, LDLDIRECT, TRIG, CHOLHDL  Significant Diagnostic Results in last 30 days:  No results found.  Assessment/Plan  #1-history of left wrist edema and pain-she will need an x-ray-rule out any acute process most notably a fracture.  For pain she does have oxycodone 5 mg nightly for 14 days she also has as needed Tylenol.  At this point will monitor the pain appears to be mainly with movement.  This plan was discussed with her family  who is actually visiting in her room and they have noted the right wrist edema and pain as well   2 history of left femoral condyle fracture she will need continued PT and OT continues on Lovenox for 30 days for DVT prophylaxis-she also continues Os-Cal with vitamin D at this point will monitor  She is nonweightbearing.  3.  Anemia this appears to be improving with a hemoglobin of 8.2-we will update a CBC.  4--- history of dementia at this point continue supportive care nursing does not really report any significant behaviors.   #5 history of neuropathy she does have orders for Neurontin 400 mg 3 times daily.  [id number]

## 2020-04-29 LAB — FOLATE: Folic Acid: 10

## 2020-04-30 ENCOUNTER — Encounter: Payer: Self-pay | Admitting: Internal Medicine

## 2020-04-30 DIAGNOSIS — D62 Acute posthemorrhagic anemia: Secondary | ICD-10-CM | POA: Insufficient documentation

## 2020-04-30 DIAGNOSIS — R54 Age-related physical debility: Secondary | ICD-10-CM | POA: Insufficient documentation

## 2020-04-30 DIAGNOSIS — Z66 Do not resuscitate: Secondary | ICD-10-CM | POA: Insufficient documentation

## 2020-04-30 DIAGNOSIS — E538 Deficiency of other specified B group vitamins: Secondary | ICD-10-CM | POA: Insufficient documentation

## 2020-04-30 DIAGNOSIS — S7292XD Unspecified fracture of left femur, subsequent encounter for closed fracture with routine healing: Secondary | ICD-10-CM | POA: Insufficient documentation

## 2020-04-30 DIAGNOSIS — F0391 Unspecified dementia with behavioral disturbance: Secondary | ICD-10-CM | POA: Insufficient documentation

## 2020-04-30 DIAGNOSIS — N1832 Chronic kidney disease, stage 3b: Secondary | ICD-10-CM | POA: Insufficient documentation

## 2020-04-30 DIAGNOSIS — M810 Age-related osteoporosis without current pathological fracture: Secondary | ICD-10-CM | POA: Insufficient documentation

## 2020-04-30 DIAGNOSIS — F03918 Unspecified dementia, unspecified severity, with other behavioral disturbance: Secondary | ICD-10-CM | POA: Insufficient documentation

## 2020-04-30 DIAGNOSIS — M961 Postlaminectomy syndrome, not elsewhere classified: Secondary | ICD-10-CM | POA: Insufficient documentation

## 2020-04-30 LAB — CBC: RBC: 3.27 — AB (ref 3.87–5.11)

## 2020-04-30 LAB — CBC AND DIFFERENTIAL
HCT: 27 — AB (ref 36–46)
Hemoglobin: 8.4 — AB (ref 12.0–16.0)
WBC: 7.9

## 2020-05-04 ENCOUNTER — Encounter: Payer: Self-pay | Admitting: Internal Medicine

## 2020-05-04 ENCOUNTER — Non-Acute Institutional Stay (SKILLED_NURSING_FACILITY): Payer: Medicare Other | Admitting: Internal Medicine

## 2020-05-04 DIAGNOSIS — D62 Acute posthemorrhagic anemia: Secondary | ICD-10-CM | POA: Diagnosis not present

## 2020-05-04 DIAGNOSIS — S7292XD Unspecified fracture of left femur, subsequent encounter for closed fracture with routine healing: Secondary | ICD-10-CM

## 2020-05-04 DIAGNOSIS — S52561A Barton's fracture of right radius, initial encounter for closed fracture: Secondary | ICD-10-CM | POA: Diagnosis not present

## 2020-05-04 NOTE — Progress Notes (Signed)
Location:    [facility] Room Number: 506/P Place of Service:  SNF [phone]) Provider:  Mervyn Skeeters, PA-C  Patient Care Team: Jonathon Bellows, PA-C as PCP - General (Physician Assistant) Re[facility], [place] ([place]) Kelvin Cellar, MD as Referring Physician (Orthopedic Surgery) [facility] Contact Information Primary Emergency Contact: Faeth,James Address: [address]          [place], [place] [address] Johnnette Litter of [place] Phone: [phone] Mobile Phone: [phone] Relation: Son Secondary Emergency Contact: [place] Mobile Phone: [phone] Relation: Son  Code Status:  DNR Goals of care: Advanced Directive information Advanced Directives 05/04/2020  Does Patient Have a Medical Advance Directive? Yes  Type of Advance Directive Out of facility DNR (pink MOST or yellow form)  Does patient want to make changes to medical advance directive? No - Patient declined  Copy of [place] in Chart? -  Would patient like information on creating a medical advance directive? -     Chief Complaint  Patient presents with   Follow-up    Right Radius Fracture    HPI:  Pt is a 86 y.o. female seen today for an acute visit for follow-up of a right radius fracture. She was seen last week for swelling on her right wrist-we reviewed an x-ray-the mobile x-ray showed a likely distal radius fracture on the right-she was sent to the ER for expedient evaluation-x-ray confirmed the fracture-Per their assessment this was thought to be most likely more chronic.  Orthopedics was consulted and recommended a splint with orthopedic follow-up.  Marland Kitchen  She was recently admitted here for short-term rehab after sustaining a left femoral condyle fracture that was surgically repaired-she apparently sustained that after fall at home.  She also had  postop anemia but this appears to be rising hemoglobin was 8.4 on lab done on June 19.  She is continues to be doing well with both orthopedic issues.  She is not really complaining of pain she does have orders for as needed oxycodone she also has orders for Neurontin.  She is currently sitting in her chair comfortably however and does not really complain.  She does have orders for nonweightbearing on the left lower extremity.  She will be getting continued PT and OT she continues on Lovenox for anticoagulation through July 8.  Currently vital signs are stable as noted above she does not complain of any pain--t is very pleasant-- nursing does not report any issues   Past Medical History:  Diagnosis Date   Anxiety    Breast cancer ([place]) 2010   Right s/p right breast mastectomy   Cancer ([place])    Depression    H/O blood clots    Neuropathic pain    Past Surgical History:  Procedure Laterality Date   ABDOMINAL HYSTERECTOMY     APPENDECTOMY     BLADDER SURGERY     BREAST SURGERY     CHOLECYSTECTOMY     GALLBLADDER SURGERY     KNEE SURGERY     MASTECTOMY     TONSILLECTOMY      Allergies  Allergen Reactions   Nsaids    Other Other (See Comments)    Erythema Erythema  Renal insufficiency Renal insufficiency  Erythema Erythema    Sulfa Antibiotics    Donepezil Nausea Only   Pregabalin Nausea Only    Outpatient Encounter Medications as of 05/04/2020  Medication Sig   acetaminophen (TYLENOL) 325 MG tablet Take 650 mg by mouth every 6 (six) hours as needed for mild pain or fever.   Calcium Carbonate-Vitamin D (CALCIUM 500/VITAMIN D PO) Take 1 tablet by mouth daily. FOR VITAMIN D DEFICIENCY   Calcium Oyster Shell 500 MG TABS Take 1 tablet by mouth daily.   cyanocobalamin 1000 MCG tablet Take 1,000 mcg by mouth daily.   enoxaparin (LOVENOX) 30 MG/0.3ML injection Inject 30 mg into the skin. INTO THE SKIN DAILY AT 6 PM FOR X 4 WEEKS   gabapentin  (NEURONTIN) 400 MG capsule Take 400 mg by mouth 2 (two) times daily. Total of 5 capsules per day    lidocaine (XYLOCAINE) 5 % ointment Apply 1 application topically as needed.   melatonin 3 MG TABS tablet Take 3 mg by mouth at bedtime. FOR INSOMNIA   Multiple Vitamins-Minerals (ICAPS AREDS 2 PO) Take 1 capsule by mouth daily.   NON FORMULARY DIET: REGULAR   oxyCODONE (ROXICODONE) 5 MG immediate release tablet Take 1 tablet (5 mg total) by mouth at bedtime for 14 days.   polyvinyl alcohol (LIQUIFILM TEARS) 1.4 % ophthalmic solution Place 1 drop into both eyes 3 (three) times daily as needed for dry eyes.   sertraline (ZOLOFT) 100 MG tablet Take 100 mg by mouth daily.   Vitamin D, Cholecalciferol, 25 MCG (1000 UT) TABS Take 1 tablet by mouth daily.   No facility-administered encounter medications on file as of 05/04/2020.    Review of Systems   This is limited secondary to dementia but she is not really complaining of any pain nursing does not report any issues-she continues to be very pleasant and cooperative  Immunization History  Administered Date(s) Administered   Influenza, High Dose Seasonal PF 09/28/2014, 09/28/2014, 09/02/2015, 09/02/2015, 11/18/2018, 09/16/2019   Influenza,inj,Quad PF,6+ Mos 09/08/2013   Influenza-Unspecified 09/08/2013, 08/24/2016, 08/24/2016, 09/05/2017   Moderna SARS-COVID-2 Vaccination 01/27/2020, 02/24/2020   Pneumococcal Conjugate-13 02/28/2015, 02/28/2015   Pneumococcal Polysaccharide-23 11/12/1994, 11/12/1994, 09/16/2019   Tdap 12/23/2014   Pertinent  Health Maintenance Due  Topic Date Due   DEXA SCAN  Never done   INFLUENZA VACCINE  06/12/2020   PNA vac Low Risk Adult  Completed   Fall Risk  04/30/2020  Falls in the past year? 1  Number falls in past yr: 1  Injury with Fall? 1  Risk for fall due to : History of fall(s);Impaired balance/gait;Impaired mobility;Mental status change;Medication side effect;Orthopedic patient;Post  anesthesia  Follow up Falls evaluation completed;Education provided;Falls prevention discussed  Comment at [facility]:    Vitals:   05/04/20 1250  BP: (!) 114/50  Pulse: 75  Resp: 18  Temp: (!) 97.3 F (36.3 C)  TempSrc: Oral  Weight: 140 lb (63.5 kg)  Height: 5\' 3"  (1.6 m)   Body mass index is 24.8 kg/m. Physical Exam In general this is a pleasant elderly female in no distress sitting comfortably in her chair.  Her skin is warm and dry.  Eyes visual acuity appears to be intact she has prescription lenses.  Oropharynx is clear mucous membranes moist.  Chest is clear to auscultation there is no labored breathing.  Heart is largely regular rate and rhythm with some occasional irregular beats she does not have significant lower extremity edema.  Abdomen is soft nontender with positive bowel sounds.  Musculoskeletal surgical site left upper leg cannot be assessed secondary to patient positioning.  She does have a splint applied to her right  lower arm and hand-she does have capillary refill-has movement of her fingers grip strength appears to be relatively intact.  Neurologic appears grossly intact cannot appreciate lateralizing findings.  Psych she is oriented to self is pleasant appropriate and continues to be cooperative Labs reviewed:  01/01/2020.  WBC 7.9 hemoglobin 8.4 platelets 380.  Uric acid was 5.7.  April 28, 2020.  Sodium 136 potassium 4.4 BUN 17.6 creatinine 1.28.  B12 was 896.  Folic acid was 38.1.   Recent Labs    04/28/20 0000  NA 136*  K 4.4  CL 105  CO2 20  BUN 22*  CREATININE 1.3*  CALCIUM 8.6*   No results for input(s): AST, ALT, ALKPHOS, BILITOT, PROT, ALBUMIN in the last 8760 hours. Recent Labs    04/21/20 0000  WBC 10.1  HGB 8.2*  HCT 27*  PLT 229   No results found for: TSH No results found for: HGBA1C No results found for: CHOL, HDL, LDLCALC, LDLDIRECT, TRIG, CHOLHDL  Significant Diagnostic  Results in last 30 days:  No results found.  Assessment/Plan #1 history of right distal radius fracture-she has been evaluated and is in a splint-she does have follow-up with orthopedics-this was thought to be most likely chronic per hospital evaluation-.  2.  History of left femoral condyle fracture status post repair she appears to be doing well with this as well she has seen orthopedics with orders for nonweightbearing she will need continued PT and OT.  She continues on Lovenox for DVT prophylaxis through May 19, 2020  She does have as needed oxycodone for pain-I do not believe she requires this very much.  She also has orders for Neurontin.  As well as a lidocaine patch  3.  Postop anemia this appears to be gradually trending up with a hemoglobin of 8.4 on June 19.  Will monitor this.  4.  History of dementia continue supportive care nursing does not report any issues in this regards-whenever I see her she is very pleasant and cooperative.  [id number] note greater than 25 minutes spent assessing patient-discussing her status with nursing staff reviewing her chart labs including orthopedic note and hospital ER note-coordinating and formulating a plan of care-of note greater than 50% of time spent coordinating plan of care with input as noted above

## 2020-05-05 ENCOUNTER — Other Ambulatory Visit: Payer: Self-pay | Admitting: Internal Medicine

## 2020-05-05 DIAGNOSIS — S7292XD Unspecified fracture of left femur, subsequent encounter for closed fracture with routine healing: Secondary | ICD-10-CM

## 2020-05-05 MED ORDER — OXYCODONE HCL 5 MG PO TABS: 5.0000 mg | ORAL_TABLET | Freq: Every day | ORAL | 0 refills | Status: AC

## 2020-05-07 ENCOUNTER — Encounter: Payer: Self-pay | Admitting: Internal Medicine

## 2020-05-09 LAB — CBC AND DIFFERENTIAL
HCT: 28 — AB (ref 36–46)
Hemoglobin: 8.7 — AB (ref 12.0–16.0)
WBC: 6.4

## 2020-05-09 LAB — BASIC METABOLIC PANEL
BUN: 33 — AB (ref 4–21)
CO2: 20 (ref 13–22)
Chloride: 109 — AB (ref 99–108)
Creatinine: 1.2 — AB (ref 0.5–1.1)
Glucose: 77
Potassium: 4.5 (ref 3.4–5.3)
Sodium: 142 (ref 137–147)

## 2020-05-09 LAB — COMPREHENSIVE METABOLIC PANEL
Calcium: 9.3 (ref 8.7–10.7)
GFR calc Af Amer: 43.71
GFR calc non Af Amer: 37.71

## 2020-05-09 LAB — CBC: RBC: 3.35 — AB (ref 3.87–5.11)

## 2020-05-27 LAB — CBC AND DIFFERENTIAL
HCT: 30 — AB (ref 36–46)
Hemoglobin: 9.4 — AB (ref 12.0–16.0)
Platelets: 224 (ref 150–399)
WBC: 8

## 2020-05-27 LAB — CBC: RBC: 3.53 — AB (ref 3.87–5.11)

## 2020-05-30 ENCOUNTER — Encounter: Payer: Self-pay | Admitting: Family

## 2020-05-30 ENCOUNTER — Non-Acute Institutional Stay (SKILLED_NURSING_FACILITY): Payer: Medicare Other | Admitting: Family

## 2020-05-30 DIAGNOSIS — S7292XD Unspecified fracture of left femur, subsequent encounter for closed fracture with routine healing: Secondary | ICD-10-CM

## 2020-05-30 DIAGNOSIS — F0391 Unspecified dementia with behavioral disturbance: Secondary | ICD-10-CM | POA: Diagnosis not present

## 2020-05-30 DIAGNOSIS — G629 Polyneuropathy, unspecified: Secondary | ICD-10-CM | POA: Diagnosis not present

## 2020-05-30 DIAGNOSIS — F321 Major depressive disorder, single episode, moderate: Secondary | ICD-10-CM

## 2020-05-30 DIAGNOSIS — N1832 Chronic kidney disease, stage 3b: Secondary | ICD-10-CM

## 2020-05-30 NOTE — Progress Notes (Signed)
Location:    [facility] Room Number: 212/D Place of Service:  SNF [phone]) Provider:  Marlowe Sax NP  Jonathon Bellows, PA-C  Patient Care Team: Jonathon Bellows, PA-C as PCP - General (Physician Assistant) Rehab, [facility] ([facility]) Kelvin Cellar, MD as Referring Physician (Orthopedic Surgery) [facility] Contact Information Primary Emergency Contact: Abdo,James Address: [address]          [place], [place] [address] Johnnette Litter of [place] Phone: [phone] Mobile Phone: [phone] Relation: Son Secondary Emergency Contact: [place] Mobile Phone: [phone] Relation: Son  Code Status:  DNR Goals of care: Advanced Directive information Advanced Directives 05/30/2020  Does Patient Have a Medical Advance Directive? Yes  Type of Advance Directive Out of facility DNR (pink MOST or yellow form)  Does patient want to make changes to medical advance directive? No - Patient declined  Copy of [place] in Chart? -  Would patient like information on creating a medical advance directive? -     Chief Complaint  Patient presents with   Medical Management of Chronic Issues    Routine visit of medical management    HPI:  Pt is a 86 y.o. female seen today for medical management of chronic diseases.she has a medical history of Depression,Dementia with Behavioral disturbance,Neuorpathy,Breast Cancer,senile Osteoporosis,CKD stage 3 among other conditions.she is seen in her room today.she states just returned from Orthopedic specialist Dr. Lorenso Courier. Had follow up appointment  for left femur  and right wrist fracture. She was advised to continue with NWB on left leg and PT to evaluate for wheelchair.Weight bearing to right upper extremity as tolerated.Also referred to Dr.John [place] Desanctis for bone health. Vitamin D 50,000 units weekly ordered.she  denies any acute issues.Pain is under control.Facility Nurse reports no new concerns.No fall episode or weight loss.    Past Medical History:  Diagnosis Date   Anxiety    Breast cancer ([facility]) 2010   Right s/p right breast mastectomy   Cancer ([facility])    Depression    H/O blood clots    Neuropathic pain    Past Surgical History:  Procedure Laterality Date   ABDOMINAL HYSTERECTOMY     APPENDECTOMY     BLADDER SURGERY     BREAST SURGERY     CHOLECYSTECTOMY     GALLBLADDER SURGERY     KNEE SURGERY     MASTECTOMY     TONSILLECTOMY      Allergies  Allergen Reactions   Nsaids    Other Other (See Comments)    Erythema Erythema  Renal insufficiency Renal insufficiency  Erythema Erythema    Sulfa Antibiotics    Donepezil Nausea Only   Pregabalin Nausea Only    Allergies as of 05/30/2020      Reactions   Nsaids    Other Other (See Comments)   Erythema Erythema Renal insufficiency Renal insufficiency Erythema Erythema   Sulfa Antibiotics    Donepezil Nausea Only   Pregabalin Nausea Only      Medication List       Accurate as of May 30, 2020  3:02 PM. If you have any questions, ask your nurse or doctor.        acetaminophen 325 MG tablet Commonly known as: TYLENOL Take 650 mg by mouth every 6 (six) hours as needed for mild pain or fever.   CALCIUM 500/VITAMIN D PO Take 1 tablet by  mouth daily. FOR VITAMIN D DEFICIENCY   Calcium Oyster Shell 500 MG Tabs Take 1 tablet by mouth daily.   cyanocobalamin 1000 MCG tablet Take 1,000 mcg by mouth daily.   gabapentin 400 MG capsule Commonly known as: NEURONTIN Take 400 mg by mouth 2 (two) times daily. Total of 5 capsules per day   ICAPS AREDS 2 PO Take 1 capsule by mouth daily.   lidocaine 5 % ointment Commonly known as: XYLOCAINE Apply 1 application topically every 12 (twelve) hours as needed. For Pain   melatonin 3 MG Tabs tablet Take 3 mg by mouth at bedtime. FOR INSOMNIA   NON  FORMULARY DIET: REGULAR   polyvinyl alcohol 1.4 % ophthalmic solution Commonly known as: LIQUIFILM TEARS Place 1 drop into both eyes 3 (three) times daily as needed for dry eyes.   sertraline 100 MG tablet Commonly known as: ZOLOFT Take 100 mg by mouth daily.   Vitamin D (Cholecalciferol) 25 MCG (1000 UT) Tabs Take 1 tablet by mouth daily.       Review of Systems  Constitutional: Negative for appetite change, chills, fatigue and fever.  HENT: Negative for congestion, rhinorrhea, sinus pressure, sinus pain, sneezing, sore throat and trouble swallowing.   Eyes: Negative for discharge, redness and itching.  Respiratory: Negative for cough, chest tightness, shortness of breath and wheezing.   Cardiovascular: Negative for chest pain, palpitations and leg swelling.  Gastrointestinal: Negative for abdominal distention, abdominal pain, constipation, diarrhea, nausea and vomiting.  Endocrine: Negative for cold intolerance, heat intolerance, polydipsia, polyphagia and polyuria.  Genitourinary: Negative for difficulty urinating, dysuria, flank pain, frequency and urgency.  Musculoskeletal: Positive for gait problem. Negative for joint swelling, myalgias and neck pain.       NWB to left lower extremity   Skin: Negative for color change, pallor and rash.  Neurological: Negative for dizziness, speech difficulty, weakness, light-headedness and headaches.  Psychiatric/Behavioral: Negative for agitation, confusion and sleep disturbance. The patient is not nervous/anxious.     Immunization History  Administered Date(s) Administered   Influenza, High Dose Seasonal PF 09/28/2014, 09/28/2014, 09/02/2015, 09/02/2015, 11/18/2018, 09/16/2019   Influenza,inj,Quad PF,6+ Mos 09/08/2013   Influenza-Unspecified 09/08/2013, 08/24/2016, 08/24/2016, 09/05/2017   Moderna SARS-COVID-2 Vaccination 01/27/2020, 02/24/2020   Pneumococcal Conjugate-13 02/28/2015, 02/28/2015   Pneumococcal Polysaccharide-23  11/12/1994, 11/12/1994, 09/16/2019   Tdap 12/23/2014   Pertinent  Health Maintenance Due  Topic Date Due   DEXA SCAN  Never done   INFLUENZA VACCINE  06/12/2020   PNA vac Low Risk Adult  Completed   Fall Risk  04/30/2020  Falls in the past year? 1  Number falls in past yr: 1  Injury with Fall? 1  Risk for fall due to : History of fall(s);Impaired balance/gait;Impaired mobility;Mental status change;Medication side effect;Orthopedic patient;Post anesthesia  Follow up Falls evaluation completed;Education provided;Falls prevention discussed  Comment at [facility]:   05/30/20 1458  BP: 130/70  Pulse: 70  Resp: 18  Temp: 98.2 F (36.8 C)  TempSrc: Oral  Weight: 127 lb 12.8 oz (58 kg)  Height: 5\' 3"  (1.6 m)   Body mass index is 22.64 kg/m. Physical Exam Vitals reviewed.  Constitutional:      General: She is not in acute distress.    Appearance: She is not ill-appearing.  HENT:     Head: Normocephalic.     Nose: Nose normal. No congestion or rhinorrhea.     Mouth/Throat:     Mouth: Mucous membranes are moist.  Pharynx: Oropharynx is clear. No oropharyngeal exudate or posterior oropharyngeal erythema.  Eyes:     General: No scleral icterus.       Right eye: No discharge.        Left eye: No discharge.     Extraocular Movements: Extraocular movements intact.     Conjunctiva/sclera: Conjunctivae normal.     Pupils: Pupils are equal, round, and reactive to light.  Neck:     Vascular: No carotid bruit.  Cardiovascular:     Rate and Rhythm: Normal rate and regular rhythm.     Pulses: Normal pulses.     Heart sounds: Normal heart sounds. No murmur heard.  No friction rub. No gallop.   Pulmonary:     Effort: Pulmonary effort is normal. No respiratory distress.     Breath sounds: Normal breath sounds. No wheezing, rhonchi or rales.  Chest:     Chest wall: No tenderness.  Abdominal:     General: Bowel sounds are normal. There is no distension.      Palpations: Abdomen is soft. There is no mass.     Tenderness: There is no abdominal tenderness. There is no right CVA tenderness, left CVA tenderness, guarding or rebound.  Musculoskeletal:        General: No swelling.     Right wrist: Tenderness present. No swelling, effusion, bony tenderness or crepitus. Normal range of motion. Normal pulse.     Left wrist: Normal.     Cervical back: Normal range of motion. No rigidity or tenderness.     Right hip: No tenderness or crepitus. Normal range of motion. Normal strength.     Right lower leg: No edema.     Left lower leg: No edema.     Comments: NWB to left lower extremity  Right writs slight tenderness to palpation   Lymphadenopathy:     Cervical: No cervical adenopathy.  Skin:    General: Skin is warm.     Coloration: Skin is not pale.     Findings: No bruising, erythema or rash.     Comments: Right hip surgical incision healed no signs of infection.  Neurological:     Mental Status: She is alert. Mental status is at baseline.     Cranial Nerves: No cranial nerve deficit.     Sensory: No sensory deficit.     Motor: No weakness.     Gait: Gait abnormal.  Psychiatric:        Mood and Affect: Mood normal.        Speech: Speech normal.        Behavior: Behavior is cooperative.        Thought Content: Thought content normal.     Labs reviewed: Recent Labs    04/28/20 0000 05/09/20 0000  NA 136* 142  K 4.4 4.5  CL 105 109*  CO2 20 20  BUN 22* 33*  CREATININE 1.3* 1.2*  CALCIUM 8.6* 9.3    Recent Labs    04/21/20 0000 04/21/20 0000 04/30/20 0000 05/09/20 0000 05/27/20 0000  WBC 10.1   < > 7.9 6.4 8.0  HGB 8.2*   < > 8.4* 8.7* 9.4*  HCT 27*   < > 27* 28* 30*  PLT 229  --   --   --  224   < > = values in this interval not displayed.   Significant Diagnostic Results in last 30 days:  No results found.  Assessment/Plan 1. Closed fracture of left femur with routine  healing, unspecified fracture morphology,  unspecified portion of femur, subsequent encounter - pain under control. - continue with NWB per Orthopedic  - continue on Vit D 50,000 units weekly and follow up with Dr.John [place] Desanctis for bone health as ordered by Wynn Maudlin  2. Chronic kidney disease, stage 3b Latest SCr reveiwed at baseline.continue to avoid nephrotoxins and dose all medication for renal clearance.   3. Neuropathy Continue on Neurontin   4. Dementia with behavioral disturbance, unspecified dementia type ([place]) - No new behavioral issues reported. - continue with supportive care   5. Current moderate episode of major depressive disorder, unspecified whether recurrent (HCC) Mood stable. - continue on sertraline 100 mg tablet daily.continue to monitor for mood changes.   Family/ staff Communication: Reviewed plan of care with patient and facility Nurse.  Labs/tests ordered: None

## 2020-06-03 LAB — CBC AND DIFFERENTIAL
HCT: 28 — AB (ref 36–46)
Hemoglobin: 9 — AB (ref 12.0–16.0)
Platelets: 233 (ref 150–399)
WBC: 7.4

## 2020-06-03 LAB — CBC: RBC: 3.33 — AB (ref 3.87–5.11)

## 2020-06-13 LAB — CBC AND DIFFERENTIAL
HCT: 29 — AB (ref 36–46)
Hemoglobin: 9.2 — AB (ref 12.0–16.0)
Platelets: 218 (ref 150–399)
WBC: 6.7

## 2020-06-13 LAB — CBC: RBC: 3.45 — AB (ref 3.87–5.11)

## 2020-06-21 LAB — CBC AND DIFFERENTIAL
HCT: 31 — AB (ref 36–46)
Hemoglobin: 9.6 — AB (ref 12.0–16.0)
Platelets: 208 (ref 150–399)
WBC: 6.8

## 2020-06-21 LAB — CBC: RBC: 3.64 — AB (ref 3.87–5.11)

## 2020-06-24 LAB — CBC: RBC: 3.66 — AB (ref 3.87–5.11)

## 2020-06-24 LAB — CBC AND DIFFERENTIAL
HCT: 31 — AB (ref 36–46)
Hemoglobin: 9.9 — AB (ref 12.0–16.0)
Platelets: 202 (ref 150–399)
WBC: 6.4

## 2020-07-13 ENCOUNTER — Non-Acute Institutional Stay (SKILLED_NURSING_FACILITY): Payer: Medicare Other | Admitting: Family

## 2020-07-13 ENCOUNTER — Encounter: Payer: Self-pay | Admitting: Family

## 2020-07-13 DIAGNOSIS — F0391 Unspecified dementia with behavioral disturbance: Secondary | ICD-10-CM | POA: Diagnosis not present

## 2020-07-13 DIAGNOSIS — F5101 Primary insomnia: Secondary | ICD-10-CM

## 2020-07-13 DIAGNOSIS — D649 Anemia, unspecified: Secondary | ICD-10-CM

## 2020-07-13 DIAGNOSIS — S7292XD Unspecified fracture of left femur, subsequent encounter for closed fracture with routine healing: Secondary | ICD-10-CM

## 2020-07-13 DIAGNOSIS — M810 Age-related osteoporosis without current pathological fracture: Secondary | ICD-10-CM | POA: Diagnosis not present

## 2020-07-13 DIAGNOSIS — G629 Polyneuropathy, unspecified: Secondary | ICD-10-CM

## 2020-07-13 NOTE — Progress Notes (Signed)
Location:    [facility] [facility].   Nursing Home Room Number: 212-D Place of Service:  SNF (31) Provider:  Marlowe Sax, NP    Patient Care Team: Jonathon Bellows, PA-C as PCP - General (Physician Assistant) Re[facility], [place] ([place]) Kelvin Cellar, MD as Referring Physician (Orthopedic Surgery) [place] Emergency Contact Information Primary Emergency Contact: Whiteside,James Address: [address]          [place], [place] [address] Johnnette Litter of [place] Phone: [phone] Mobile Phone: [phone] Relation: Son Secondary Emergency Contact: [place] Mobile Phone: [phone] Relation: Son  Code Status:  DNR Goals of care: Advanced Directive information Advanced Directives 07/13/2020  Does Patient Have a Medical Advance Directive? Yes  Type of Paramedic of [place];Living will;Out of facility DNR (pink MOST or yellow form)  Does patient want to make changes to medical advance directive? No - Patient declined  Copy of [place] in Chart? No - copy requested  Would patient like information on creating a medical advance directive? -     Chief Complaint  Patient presents with  . Medical Management of Chronic Issues    Routine Visit.   Marland Kitchen Health Maintenance    Discuss the need for Dexa Scan.  . Immunizations    Discuss the need for Influenza Vaccine.     HPI:  Pt is a 86 y.o. female seen today for medical management of chronic diseases.she is seen today awake in bed.she denies any acute issues.she here at rehab post left femoral condyle fracture post surgical repair after she had a fall at home 04/28/2020.she also had a right wrist chronic distal radius fracture noted in 04/2020 splint recommended.she denies any pain.she was seen by Orthopedic 07/01/2020 PT/OT restarted then follow up with Ortho 08/15/2020.  She had anemia post op.latest Hgb 9.9  previous 9.6 ; 9.0 she denies any fatigue headache,lightheadedness or dizziness. She enjoys coloring whenever out of the bed.  She is due for bone density but will postpone until after therapy.currently on ca and vit D supplement            Past Medical History:  Diagnosis Date  . Anxiety   . Breast cancer [facility]) 2010   Right s/p right breast mastectomy  . Cancer ([place])   . Depression   . H/O blood clots   . Neuropathic pain    Past Surgical History:  Procedure Laterality Date  . ABDOMINAL HYSTERECTOMY    . APPENDECTOMY    . BLADDER SURGERY    . BREAST SURGERY    . CHOLECYSTECTOMY    . GALLBLADDER SURGERY    . KNEE SURGERY    . MASTECTOMY    . TONSILLECTOMY      Allergies  Allergen Reactions  . Nsaids   . Other Other (See Comments)    Erythema Erythema  Renal insufficiency Renal insufficiency  Erythema Erythema   . Sulfa Antibiotics   . Donepezil Nausea Only  . Pregabalin Nausea Only    Allergies as of 07/13/2020      Reactions   Nsaids    Other Other (See Comments)   Erythema Erythema Renal insufficiency Renal insufficiency Erythema Erythema   Sulfa Antibiotics    Donepezil Nausea Only   Pregabalin Nausea Only      Medication List       Accurate as of July 13, 2020 10:48 AM. If you have any questions, ask  your nurse or doctor.        STOP taking these medications   CALCIUM 500/VITAMIN D PO Stopped by: Sandrea Hughs, NP     TAKE these medications   acetaminophen 325 MG tablet Commonly known as: TYLENOL Take 650 mg by mouth every 6 (six) hours as needed for mild pain or fever.   Calcium Oyster Shell 500 MG Tabs Take 1 tablet by mouth daily.   cyanocobalamin 1000 MCG tablet Take 1,000 mcg by mouth daily.   gabapentin 400 MG capsule Commonly known as: NEURONTIN Take 400 mg by mouth 2 (two) times daily. Total of 5 capsules per day   ICAPS AREDS 2 PO Take 1 capsule by mouth daily.   lidocaine 5 % ointment Commonly known as:  XYLOCAINE Apply 1 application topically every 12 (twelve) hours as needed. For Pain   melatonin 3 MG Tabs tablet Take 3 mg by mouth at bedtime. FOR INSOMNIA   NON FORMULARY DIET: REGULAR   polyvinyl alcohol 1.4 % ophthalmic solution Commonly known as: LIQUIFILM TEARS Place 1 drop into both eyes 3 (three) times daily as needed for dry eyes.   sertraline 100 MG tablet Commonly known as: ZOLOFT Take 100 mg by mouth daily.   VITAMIN D3 PO Take 1,000 Units by mouth 2 (two) times daily. What changed: Another medication with the same name was removed. Continue taking this medication, and follow the directions you see here. Changed by: Sandrea Hughs, NP       Review of Systems  Constitutional: Negative for appetite change, chills, fatigue and fever.  HENT: Positive for hearing loss. Negative for congestion, postnasal drip, rhinorrhea, sinus pressure, sinus pain, sneezing, sore throat and trouble swallowing.   Eyes: Negative for pain, discharge, redness and itching.  Respiratory: Negative for cough, chest tightness, shortness of breath and wheezing.   Cardiovascular: Negative for chest pain, palpitations and leg swelling.  Gastrointestinal: Negative for abdominal distention, abdominal pain, constipation, diarrhea, nausea and vomiting.  Endocrine: Negative for cold intolerance, heat intolerance, polydipsia, polyphagia and polyuria.  Genitourinary: Negative for difficulty urinating, dysuria and urgency.       Incontinent   Musculoskeletal: Positive for gait problem. Negative for back pain, joint swelling and myalgias.  Skin: Negative for color change, pallor, rash and wound.  Neurological: Negative for dizziness, seizures, speech difficulty, weakness, light-headedness and headaches.  Hematological: Does not bruise/bleed easily.  Psychiatric/Behavioral: Negative for agitation, behavioral problems, confusion and sleep disturbance. The patient is not nervous/anxious.     Immunization  History  Administered Date(s) Administered  . Influenza, High Dose Seasonal PF 09/28/2014, 09/28/2014, 09/02/2015, 09/02/2015, 11/18/2018, 09/16/2019  . Influenza,inj,Quad PF,6+ Mos 09/08/2013  . Influenza-Unspecified 09/08/2013, 08/24/2016, 08/24/2016, 09/05/2017  . Moderna SARS-COVID-2 Vaccination 01/27/2020, 02/24/2020  . Pneumococcal Conjugate-13 02/28/2015, 02/28/2015  . Pneumococcal Polysaccharide-23 11/12/1994, 11/12/1994, 09/16/2019  . Tdap 12/23/2014   Pertinent  Health Maintenance Due  Topic Date Due  . DEXA SCAN  Never done  . INFLUENZA VACCINE  06/12/2020  . PNA vac Low Risk Adult  Completed   Fall Risk  04/30/2020  Falls in the past year? 1  Number falls in past yr: 1  Injury with Fall? 1  Risk for fall due to : History of fall(s);Impaired balance/gait;Impaired mobility;Mental status change;Medication side effect;Orthopedic patient;Post anesthesia  Follow up Falls evaluation completed;Education provided;Falls prevention discussed  Comment at [place]:   07/13/20 1030  BP: 110/60  Pulse: 77  Resp: 20  Temp: (!) 97.2 F (  36.2 C)  Weight: 126 lb 4.8 oz (57.3 kg)  Height: 5\' 3"  (1.6 m)   Body mass index is 22.37 kg/m. Physical Exam Vitals and nursing note reviewed.  Constitutional:      General: She is not in acute distress.    Appearance: She is not ill-appearing.  HENT:     Head: Normocephalic.     Nose: Nose normal. No congestion or rhinorrhea.     Mouth/Throat:     Mouth: Mucous membranes are moist.     Pharynx: Oropharynx is clear. No oropharyngeal exudate or posterior oropharyngeal erythema.  Eyes:     General: No scleral icterus.       Right eye: No discharge.        Left eye: No discharge.     Extraocular Movements: Extraocular movements intact.     Conjunctiva/sclera: Conjunctivae normal.     Pupils: Pupils are equal, round, and reactive to light.  Neck:     Vascular: No carotid bruit.  Cardiovascular:     Rate and Rhythm: Normal  rate and regular rhythm.     Pulses: Normal pulses.     Heart sounds: Normal heart sounds. No murmur heard.  No friction rub. No gallop.   Pulmonary:     Effort: Pulmonary effort is normal. No respiratory distress.     Breath sounds: Normal breath sounds. No wheezing, rhonchi or rales.  Chest:     Chest wall: No tenderness.  Abdominal:     General: Bowel sounds are normal. There is no distension.     Palpations: Abdomen is soft. There is no mass.     Tenderness: There is no abdominal tenderness. There is no right CVA tenderness, left CVA tenderness, guarding or rebound.  Musculoskeletal:        General: No swelling.     Cervical back: Normal range of motion. No rigidity or tenderness.     Right lower leg: No edema.     Left lower leg: No edema.     Comments: Unsteady   Lymphadenopathy:     Cervical: No cervical adenopathy.  Skin:    General: Skin is warm.     Coloration: Skin is not jaundiced or pale.     Findings: No bruising, erythema or rash.     Comments: Left hip surgical incision healed.  Neurological:     Mental Status: She is alert and oriented to person, place, and time.     Cranial Nerves: No cranial nerve deficit.     Motor: No weakness.     Coordination: Coordination abnormal.     Gait: Gait abnormal.  Psychiatric:        Mood and Affect: Mood normal.        Behavior: Behavior normal.        Thought Content: Thought content normal.        Judgment: Judgment normal.    Labs reviewed: Recent Labs    04/28/20 0000 05/09/20 0000  NA 136* 142  K 4.4 4.5  CL 105 109*  CO2 20 20  BUN 22* 33*  CREATININE 1.3* 1.2*  CALCIUM 8.6* 9.3    Recent Labs    06/13/20 0000 06/21/20 0000 06/24/20 0000  WBC 6.7 6.8 6.4  HGB 9.2* 9.6* 9.9*  HCT 29* 31* 31*  PLT 218 208 202    Significant Diagnostic Results in last 30 days:  No results found.  Assessment/Plan 1. Anemia, unspecified type Hemoglobin post-op has improved. Continue to encourage oral intake.  Monitor H/H    2. Closed fracture of left femur with routine healing, unspecified fracture morphology, unspecified portion of femur, subsequent encounter Left hip incision healed.seen by Orthopedic 07/01/2020 PT/OT restarted. - pain under controlled on tylenol PRN - continue to follow up with Orthopedic as directed   3. Senile osteoporosis Will need Dexa post rehab. Continue on calcium and vit D supplement.   4. Dementia with behavioral disturbance, unspecified dementia type ([place]) No new behavioral issues reported. Continue with supportive care   5. Neuropathy Continue on gabapentin   6. Primary insomnia Melatonin effective.   Family/ staff Communication: Reviewed plan of care with patient and facility Nurse   Labs/tests ordered: None

## 2020-08-15 ENCOUNTER — Non-Acute Institutional Stay (SKILLED_NURSING_FACILITY): Payer: Medicare Other | Admitting: Family

## 2020-08-15 ENCOUNTER — Encounter: Payer: Self-pay | Admitting: Family

## 2020-08-15 DIAGNOSIS — F321 Major depressive disorder, single episode, moderate: Secondary | ICD-10-CM

## 2020-08-15 DIAGNOSIS — F0391 Unspecified dementia with behavioral disturbance: Secondary | ICD-10-CM

## 2020-08-15 DIAGNOSIS — S7292XD Unspecified fracture of left femur, subsequent encounter for closed fracture with routine healing: Secondary | ICD-10-CM | POA: Diagnosis not present

## 2020-08-15 DIAGNOSIS — G629 Polyneuropathy, unspecified: Secondary | ICD-10-CM

## 2020-08-15 DIAGNOSIS — M810 Age-related osteoporosis without current pathological fracture: Secondary | ICD-10-CM

## 2020-08-15 NOTE — Progress Notes (Signed)
Location:    [place].   Nursing Home Room Number: 212-D Place of Service:  SNF (31) Provider:  Marlowe Sax, NP  Patient Care Team: Jonathon Bellows, PA-C as PCP - General (Physician Assistant) Rehab, [place] ([place]) Kelvin Cellar, MD as Referring Physician (Orthopedic Surgery) [place] Emergency Contact Information Primary Emergency Contact: Sabey,James Address: [address]          [place], [place] [address] Johnnette Litter of [place] Phone: [phone] Mobile Phone: [phone] Relation: Son Secondary Emergency Contact: [place] Mobile Phone: [phone] Relation: Son  Code Status:  DNR Goals of care: Advanced Directive information Advanced Directives 08/15/2020  Does Patient Have a Medical Advance Directive? Yes  Type of Paramedic of [place];Living will;Out of facility DNR (pink MOST or yellow form)  Does patient want to make changes to medical advance directive? No - Patient declined  Copy of [place] in Chart? No - copy requested  Would patient like information on creating a medical advance directive? -     Chief Complaint  Patient presents with  . Medical Management of Chronic Issues    Routine Visit.   . Immunizations    Discuss the need for Dexa Scan, and Influenza Vaccine.    HPI:  Pt is a 86 y.o. female seen today for medical management of chronic diseases.she denies any acute issues during visit.she went out for follow up with Orthopedic at [facility]  today prior to visit for her spiral fracture of left femur.X-ray done showed the displaced femoral shaft fracture stabilized by lateral plate and screws with minimal callus formation at the proximal aspect of the fracture without any dislocation,abnormal lesions and no hardware failure.Orthopedic notes review indicates minimal healing at her fracture site concerning for  low vit D.treatment option discussed with patient and family including observation weight-bearing as tolerated with Therapy versus surgery for revision open reduction internal fixation.patient's family elected to proceed with non-operative management. Labs vit D and calcium,PT/OT and weight bear as tolerated on left lower extremity ordered then follow up with orthopedic in 6 weeks for repeat exam and X-ray of left femur.  No recent fall episodes or weight changes.states appetite is good.Facility Nurse reports no new concerns.   she is due for Influenza vaccine.will be administered per facility protocol if desired.    Past Medical History:  Diagnosis Date  . Anxiety   . Breast cancer [facility]) 2010   Right s/p right breast mastectomy  . Cancer ([place])   . Depression   . H/O blood clots   . Neuropathic pain    Past Surgical History:  Procedure Laterality Date  . ABDOMINAL HYSTERECTOMY    . APPENDECTOMY    . BLADDER SURGERY    . BREAST SURGERY    . CHOLECYSTECTOMY    . GALLBLADDER SURGERY    . KNEE SURGERY    . MASTECTOMY    . TONSILLECTOMY      Allergies  Allergen Reactions  . Nsaids   . Other Other (See Comments)    Erythema Erythema  Renal insufficiency Renal insufficiency  Erythema Erythema   . Sulfa Antibiotics   . Donepezil Nausea Only  . Pregabalin Nausea Only    Allergies as of 08/15/2020      Reactions   Nsaids    Other Other (See Comments)   Erythema Erythema Renal insufficiency Renal insufficiency Erythema Erythema   Sulfa Antibiotics  Donepezil Nausea Only   Pregabalin Nausea Only      Medication List       Accurate as of August 15, 2020 12:29 PM. If you have any questions, ask your nurse or doctor.        acetaminophen 325 MG tablet Commonly known as: TYLENOL Take 650 mg by mouth every 6 (six) hours as needed for mild pain or fever.   Calcium Oyster Shell 500 MG Tabs Take 1 tablet by mouth daily.   cyanocobalamin 1000 MCG tablet Take  1,000 mcg by mouth daily.   gabapentin 400 MG capsule Commonly known as: NEURONTIN Take 400 mg by mouth 2 (two) times daily. Total of 5 capsules per day   ICAPS AREDS 2 PO Take 1 capsule by mouth daily.   lidocaine 5 % ointment Commonly known as: XYLOCAINE Apply 1 application topically every 12 (twelve) hours as needed. For Pain   melatonin 3 MG Tabs tablet Take 3 mg by mouth at bedtime. FOR INSOMNIA   NON FORMULARY DIET: REGULAR   polyvinyl alcohol 1.4 % ophthalmic solution Commonly known as: LIQUIFILM TEARS Place 1 drop into both eyes 3 (three) times daily as needed for dry eyes.   promethazine 25 MG tablet Commonly known as: PHENERGAN Take 25 mg by mouth every 6 (six) hours as needed for nausea or vomiting.   sertraline 100 MG tablet Commonly known as: ZOLOFT Take 100 mg by mouth daily.   VITAMIN D3 PO Take 1,000 Units by mouth 2 (two) times daily.       Review of Systems  Constitutional: Negative for appetite change, chills, fatigue and fever.  HENT: Positive for hearing loss. Negative for congestion, rhinorrhea, sinus pressure, sinus pain, sneezing and sore throat.   Eyes: Negative for discharge, redness and itching.  Respiratory: Negative for cough, chest tightness, shortness of breath and wheezing.   Cardiovascular: Negative for chest pain, palpitations and leg swelling.  Gastrointestinal: Negative for abdominal distention, abdominal pain, constipation, diarrhea, nausea and vomiting.  Endocrine: Negative for cold intolerance, heat intolerance, polydipsia, polyphagia and polyuria.  Genitourinary: Negative for difficulty urinating, dysuria, flank pain and urgency.  Musculoskeletal: Positive for arthralgias and gait problem. Negative for joint swelling and myalgias.  Skin: Negative for color change, pallor and rash.  Neurological: Negative for dizziness, speech difficulty, weakness, light-headedness and headaches.  Hematological: Does not bruise/bleed easily.    Psychiatric/Behavioral: Negative for agitation, behavioral problems, confusion and sleep disturbance. The patient is not nervous/anxious.     Immunization History  Administered Date(s) Administered  . Influenza, High Dose Seasonal PF 09/28/2014, 09/28/2014, 09/02/2015, 09/02/2015, 11/18/2018, 09/16/2019  . Influenza,inj,Quad PF,6+ Mos 09/08/2013  . Influenza-Unspecified 09/08/2013, 08/24/2016, 08/24/2016, 09/05/2017  . Moderna SARS-COVID-2 Vaccination 01/27/2020, 02/24/2020  . Pneumococcal Conjugate-13 02/28/2015, 02/28/2015  . Pneumococcal Polysaccharide-23 11/12/1994, 11/12/1994, 09/16/2019  . Tdap 12/23/2014   Pertinent  Health Maintenance Due  Topic Date Due  . DEXA SCAN  Never done  . INFLUENZA VACCINE  06/12/2020  . PNA vac Low Risk Adult  Completed   Fall Risk  04/30/2020  Falls in the past year? 1  Number falls in past yr: 1  Injury with Fall? 1  Risk for fall due to : History of fall(s);Impaired balance/gait;Impaired mobility;Mental status change;Medication side effect;Orthopedic patient;Post anesthesia  Follow up Falls evaluation completed;Education provided;Falls prevention discussed  Comment at [place]:   08/15/20 1225  BP: 106/60  Pulse: 71  Resp: 20  Temp: 98.4 F (36.9 C)  SpO2: 98%  Weight: 127 lb 9.6 oz (57.9 kg)  Height: 5\' 3"  (1.6 m)   Body mass index is 22.6 kg/m. Physical Exam Vitals and nursing note reviewed.  Constitutional:      Appearance: She is normal weight. She is not ill-appearing.  HENT:     Head: Normocephalic.     Nose: Nose normal. No congestion or rhinorrhea.     Mouth/Throat:     Mouth: Mucous membranes are moist.     Pharynx: Oropharynx is clear. No oropharyngeal exudate or posterior oropharyngeal erythema.  Eyes:     General: No scleral icterus.       Right eye: No discharge.        Left eye: No discharge.     Conjunctiva/sclera: Conjunctivae normal.     Pupils: Pupils are equal, round, and reactive to light.   Cardiovascular:     Rate and Rhythm: Normal rate and regular rhythm.     Pulses: Normal pulses.     Heart sounds: Normal heart sounds. No murmur heard.  No friction rub. No gallop.   Pulmonary:     Effort: Pulmonary effort is normal. No respiratory distress.     Breath sounds: Normal breath sounds. No wheezing, rhonchi or rales.  Chest:     Chest wall: No tenderness.  Abdominal:     General: Bowel sounds are normal. There is no distension.     Palpations: Abdomen is soft. There is no mass.     Tenderness: There is no abdominal tenderness. There is no right CVA tenderness, left CVA tenderness, guarding or rebound.  Genitourinary:    Comments: Incontinent  Musculoskeletal:        General: No swelling.     Cervical back: Normal range of motion. No rigidity or tenderness.     Right lower leg: No edema.     Left lower leg: No edema.     Comments: Unsteady gait transfers to wheelchair with assistance   Lymphadenopathy:     Cervical: No cervical adenopathy.  Skin:    General: Skin is warm and dry.     Coloration: Skin is not pale.     Findings: No bruising, erythema or rash.  Neurological:     Mental Status: She is alert. Mental status is at baseline.     Cranial Nerves: No cranial nerve deficit.     Sensory: No sensory deficit.     Coordination: Coordination normal.     Gait: Gait abnormal.     Comments: HOH   Psychiatric:        Mood and Affect: Mood normal.        Speech: Speech normal.        Behavior: Behavior normal.        Thought Content: Thought content normal.        Cognition and Memory: Memory is impaired.    Labs reviewed: Recent Labs    04/28/20 0000 05/09/20 0000  NA 136* 142  K 4.4 4.5  CL 105 109*  CO2 20 20  BUN 22* 33*  CREATININE 1.3* 1.2*  CALCIUM 8.6* 9.3    Recent Labs    06/13/20 0000 06/21/20 0000 06/24/20 0000  WBC 6.7 6.8 6.4  HGB 9.2* 9.6* 9.9*  HCT 29* 31* 31*  PLT 218 208 202    Significant Diagnostic Results in last 30  days:  No results found.  Assessment/Plan 1. Closed fracture of left femur with routine healing, unspecified fracture morphology, unspecified portion of femur, subsequent encounter  Pain under control. - WBAT per Orthopedic specialist  - continue PT/OT  - fall and safety precaution   2. Dementia with behavioral disturbance, unspecified dementia type ([place]) No new concerns. - continue with supportive care  - continue on sertraline   3. Neuropathy Continue on Gabapentin   4. Current moderate episode of major depressive disorder, unspecified whether recurrent (H[facility]) Mood stable. - continue on sertraline and monitor for mood changes  5. Senile osteoporosis Continue on vit D 3  And calcium supplement Will discuss need for Dexa scan with POA on next visit   Family/ staff Communication: Reviewed plan of care with patient and facility Nurse    Labs/tests ordered: None

## 2020-08-17 LAB — COMPREHENSIVE METABOLIC PANEL: Calcium: 9.1 (ref 8.7–10.7)

## 2020-08-17 LAB — VITAMIN D 25 HYDROXY (VIT D DEFICIENCY, FRACTURES): Vit D, 25-Hydroxy: 28.9

## 2020-09-15 ENCOUNTER — Encounter: Payer: Self-pay | Admitting: Family

## 2020-09-15 ENCOUNTER — Non-Acute Institutional Stay (SKILLED_NURSING_FACILITY): Payer: Medicare Other | Admitting: Family

## 2020-09-15 DIAGNOSIS — U071 COVID-19: Secondary | ICD-10-CM | POA: Diagnosis not present

## 2020-09-15 DIAGNOSIS — F0391 Unspecified dementia with behavioral disturbance: Secondary | ICD-10-CM | POA: Diagnosis not present

## 2020-09-15 DIAGNOSIS — G629 Polyneuropathy, unspecified: Secondary | ICD-10-CM | POA: Diagnosis not present

## 2020-09-15 DIAGNOSIS — D649 Anemia, unspecified: Secondary | ICD-10-CM | POA: Diagnosis not present

## 2020-09-15 DIAGNOSIS — E538 Deficiency of other specified B group vitamins: Secondary | ICD-10-CM

## 2020-09-15 DIAGNOSIS — N1832 Chronic kidney disease, stage 3b: Secondary | ICD-10-CM

## 2020-09-15 NOTE — Progress Notes (Signed)
Location:    [place].   Nursing Home Room Number: 423-D Place of Service:  SNF (31) Provider:  Marlowe Sax, NP     Patient Care Team: Jonathon Bellows, PA-C as PCP - General (Physician Assistant) Rehab, [facility] ([place]) Kelvin Cellar, MD as Referring Physician (Orthopedic Surgery) [facility] Contact Information Primary Emergency Contact: Brissette,James Address: [address]          [place],  [address] Johnnette Litter of [place] Phone: [phone] Mobile Phone: [phone] Relation: Son Secondary Emergency Contact: [place] Mobile Phone: [phone] Relation: Son  Code Status:  DNR Goals of care: Advanced Directive information Advanced Directives 09/15/2020  Does Patient Have a Medical Advance Directive? Yes  Type of Advance Directive Living will;Healthcare Power of [place];Out of facility DNR (pink MOST or yellow form)  Does patient want to make changes to medical advance directive? No - Patient declined  Copy of [place] in Chart? Yes - validated most recent copy scanned in chart (See row information)  Would patient like information on creating a medical advance directive? -     Chief Complaint  Patient presents with  . Medical Management of Chronic Issues    Routine Visit.   Marland Kitchen Health Maintenance    Discuss the need for Dexa Scan.  . Immunizations    Discuss the need for Influenza Vaccine.    HPI:  Pt is a 86 y.o. female seen today for medical management of chronic diseases.she has a medical history of Dementia with behavioral disturbance, Neuropathy,Insomnia,anemia, left hip pain among other conditions. She is seen in her room on droplet Precautions.she tested positive for COVID-19 on 09/08/2020.she has received Moderna vaccine x 2 doses.  She is happy being in the room by herself states does not mind remaining here even after she  completes her quarantine days though she is away that she temporally in a private room.  She has had a weight loss of 3 lbs over one month in October no weight for review today.she gets weighed at the middle of the month.  She denies any acute issues.she does not have any COVID-19 symptoms.  Her blood pressure readings are in the 120's/70's - 130's/70's.     Past Medical History:  Diagnosis Date  . Anxiety   . Breast cancer [facility]) 2010   Right s/p right breast mastectomy  . Cancer ([place])   . Depression   . H/O blood clots   . Neuropathic pain    Past Surgical History:  Procedure Laterality Date  . ABDOMINAL HYSTERECTOMY    . APPENDECTOMY    . BLADDER SURGERY    . BREAST SURGERY    . CHOLECYSTECTOMY    . GALLBLADDER SURGERY    . KNEE SURGERY    . MASTECTOMY    . TONSILLECTOMY      Allergies  Allergen Reactions  . Nsaids   . Other Other (See Comments)    Erythema Erythema  Renal insufficiency Renal insufficiency  Erythema Erythema   . Sulfa Antibiotics   . Donepezil Nausea Only  . Pregabalin Nausea Only    Allergies as of 09/15/2020      Reactions   Nsaids    Other Other (See Comments)   Erythema Erythema Renal insufficiency Renal insufficiency Erythema Erythema   Sulfa Antibiotics    Donepezil Nausea Only   Pregabalin Nausea Only      Medication List  Accurate as of September 15, 2020 12:46 PM. If you have any questions, ask your nurse or doctor.        acetaminophen 325 MG tablet Commonly known as: TYLENOL Take 650 mg by mouth every 6 (six) hours as needed for mild pain or fever.   Calcium Oyster Shell 500 MG Tabs Take 1 tablet by mouth daily.   cyanocobalamin 1000 MCG tablet Take 1,000 mcg by mouth daily.   gabapentin 400 MG capsule Commonly known as: NEURONTIN Take 400 mg by mouth 2 (two) times daily. Total of 5 capsules per day   ICAPS AREDS 2 PO Take 1 capsule by mouth daily.   lidocaine 5 % ointment Commonly known as:  XYLOCAINE Apply 1 application topically every 12 (twelve) hours as needed. For Pain   melatonin 3 MG Tabs tablet Take 3 mg by mouth at bedtime. FOR INSOMNIA   NON FORMULARY DIET: REGULAR   polyvinyl alcohol 1.4 % ophthalmic solution Commonly known as: LIQUIFILM TEARS Place 1 drop into both eyes 3 (three) times daily as needed for dry eyes.   promethazine 25 MG tablet Commonly known as: PHENERGAN Take 25 mg by mouth every 6 (six) hours as needed for nausea or vomiting.   sertraline 100 MG tablet Commonly known as: ZOLOFT Take 100 mg by mouth daily.   VITAMIN D3 PO Take 1,000 Units by mouth 2 (two) times daily.       Review of Systems  Constitutional: Negative for appetite change, chills, fatigue and fever.  HENT: Negative for congestion, rhinorrhea, sinus pressure, sinus pain, sneezing and sore throat.        No loss of taste or smell   Eyes: Negative for discharge, redness and itching.  Respiratory: Negative for cough, chest tightness, shortness of breath and wheezing.   Cardiovascular: Negative for chest pain, palpitations and leg swelling.  Gastrointestinal: Negative for abdominal distention, abdominal pain, constipation, diarrhea, nausea and vomiting.  Endocrine: Negative for cold intolerance, heat intolerance, polydipsia, polyphagia and polyuria.  Genitourinary: Negative for difficulty urinating, dysuria, flank pain and urgency.  Musculoskeletal: Positive for arthralgias and gait problem. Negative for joint swelling and myalgias.  Skin: Negative for color change, pallor and rash.  Neurological: Positive for numbness. Negative for dizziness, speech difficulty, light-headedness and headaches.  Hematological: Does not bruise/bleed easily.  Psychiatric/Behavioral: Negative for agitation, behavioral problems and sleep disturbance. The patient is not nervous/anxious.     Immunization History  Administered Date(s) Administered  . Influenza, High Dose Seasonal PF 09/28/2014,  09/28/2014, 09/02/2015, 09/02/2015, 11/18/2018, 09/16/2019  . Influenza,inj,Quad PF,6+ Mos 09/08/2013  . Influenza-Unspecified 09/08/2013, 08/24/2016, 08/24/2016, 09/05/2017  . Moderna SARS-COVID-2 Vaccination 01/27/2020, 02/24/2020  . Pneumococcal Conjugate-13 02/28/2015, 02/28/2015  . Pneumococcal Polysaccharide-23 11/12/1994, 11/12/1994, 09/16/2019  . Tdap 12/23/2014   Pertinent  Health Maintenance Due  Topic Date Due  . DEXA SCAN  Never done  . INFLUENZA VACCINE  06/12/2020  . PNA vac Low Risk Adult  Completed   Fall Risk  04/30/2020  Falls in the past year? 1  Number falls in past yr: 1  Injury with Fall? 1  Risk for fall due to : History of fall(s);Impaired balance/gait;Impaired mobility;Mental status change;Medication side effect;Orthopedic patient;Post anesthesia  Follow up Falls evaluation completed;Education provided;Falls prevention discussed  Comment at [place]:    Vitals:   09/15/20 1236  BP: 135/78  Pulse: 88  Resp: 18  Temp: (!) 97.2 F (36.2 C)  SpO2: 97%  Weight: 125 lb 6.4 oz (  56.9 kg)  Height: 5\' 3"  (1.6 m)   Body mass index is 22.21 kg/m. Physical Exam Vitals and nursing note reviewed.  Constitutional:      General: She is not in acute distress.    Appearance: She is normal weight. She is not ill-appearing.  HENT:     Head: Normocephalic.     Nose: Nose normal. No congestion or rhinorrhea.     Mouth/Throat:     Mouth: Mucous membranes are moist.     Pharynx: Oropharynx is clear. No oropharyngeal exudate or posterior oropharyngeal erythema.  Eyes:     General: No scleral icterus.       Right eye: No discharge.        Left eye: No discharge.     Extraocular Movements: Extraocular movements intact.     Conjunctiva/sclera: Conjunctivae normal.     Pupils: Pupils are equal, round, and reactive to light.  Cardiovascular:     Rate and Rhythm: Normal rate and regular rhythm.     Pulses: Normal pulses.     Heart  sounds: Normal heart sounds. No murmur heard.  No friction rub. No gallop.   Pulmonary:     Effort: Pulmonary effort is normal. No respiratory distress.     Breath sounds: Normal breath sounds. No wheezing, rhonchi or rales.  Chest:     Chest wall: No tenderness.  Abdominal:     General: Bowel sounds are normal. There is no distension.     Palpations: Abdomen is soft. There is no mass.     Tenderness: There is no abdominal tenderness. There is no right CVA tenderness, guarding or rebound.  Musculoskeletal:        General: No swelling or tenderness.     Cervical back: Normal range of motion. No rigidity or tenderness.     Right lower leg: No edema.     Left lower leg: No edema.     Comments: Unsteady gait tranfers with assistance to wheelchair  Lymphadenopathy:     Cervical: No cervical adenopathy.  Skin:    General: Skin is warm and dry.     Coloration: Skin is not pale.     Findings: No bruising, erythema or rash.  Neurological:     Mental Status: She is alert. Mental status is at baseline.     Cranial Nerves: No cranial nerve deficit.     Sensory: No sensory deficit.     Motor: No weakness.     Coordination: Coordination normal.     Gait: Gait abnormal.  Psychiatric:        Mood and Affect: Mood normal.        Speech: Speech normal.        Behavior: Behavior normal.        Thought Content: Thought content normal.        Judgment: Judgment normal.    Labs reviewed: Recent Labs    04/28/20 0000 05/09/20 0000  NA 136* 142  K 4.4 4.5  CL 105 109*  CO2 20 20  BUN 22* 33*  CREATININE 1.3* 1.2*  CALCIUM 8.6* 9.3    Recent Labs    06/13/20 0000 06/21/20 0000 06/24/20 0000  WBC 6.7 6.8 6.4  HGB 9.2* 9.6* 9.9*  HCT 29* 31* 31*  PLT 218 208 202    Significant Diagnostic Results in last 30 days:  No results found.  Assessment/Plan 1. Lab test positive for detection of COVID-19 virus Tested positive 09/08/2020  - Asymptomatic  -  continue on droplet  precaution Quarantine. - will initiate refer for monoclonal antibodies therapy if she present with symptoms due to her advance age.   2. Neuropathy Continue on Gabapentin 400 mg capsule twice daily.   3. Dementia with behavioral disturbance, unspecified dementia type ([place]) No new behavioral issues reported. - continue on supportive care.   4. Anemia, unspecified type Latest Hgb at baseline.  5. Chronic kidney disease, stage 3b (HCC) Latest CR 1.2 previous 1.3  Continue to avoid nephrotoxins and dose all other medication for renal clearance.   6. B12 deficiency Latest Vit B 12 level normal 896 ( 04/28/2020). Continue on Vitamin B 12 1000 mcg tablet daily.   Family/ staff Communication: Reviewed plan of care with patient and facility Nurse   Labs/tests ordered: None

## 2020-09-27 ENCOUNTER — Encounter: Payer: Self-pay | Admitting: Internal Medicine

## 2020-10-05 LAB — COMPREHENSIVE METABOLIC PANEL: Calcium: 9.2 (ref 8.7–10.7)

## 2020-10-05 LAB — VITAMIN D 25 HYDROXY (VIT D DEFICIENCY, FRACTURES): Vit D, 25-Hydroxy: 25.3

## 2020-10-12 ENCOUNTER — Encounter: Payer: Self-pay | Admitting: Orthopedic Surgery

## 2020-10-12 ENCOUNTER — Non-Acute Institutional Stay (SKILLED_NURSING_FACILITY): Payer: Medicare Other | Admitting: Orthopedic Surgery

## 2020-10-12 DIAGNOSIS — R1310 Dysphagia, unspecified: Secondary | ICD-10-CM | POA: Diagnosis not present

## 2020-10-12 DIAGNOSIS — M542 Cervicalgia: Secondary | ICD-10-CM | POA: Diagnosis not present

## 2020-10-12 NOTE — Progress Notes (Signed)
Location:    [facility] Room Number: 212/D Place of Service:  SNF [phone]) Provider: Windell Moulding AGNP-C  [place], Sindy Messing, PA-C  Patient Care Team: Jonathon Bellows, PA-C as PCP - General (Physician Assistant) Rehab, [facility] ([place]) Kelvin Cellar, MD as Referring Physician (Orthopedic Surgery) [facility] Contact Information Primary Emergency Contact: Schrecengost,James Address: [address]          [place], [place] [address] Johnnette Litter of [place] Phone: [phone] Mobile Phone: [phone] Relation: Son Secondary Emergency Contact: [place] Mobile Phone: [phone] Relation: Son  Code Status: DNR  Goals of care: Advanced Directive information Advanced Directives 10/12/2020  Does Patient Have a Medical Advance Directive? Yes  Type of Advance Directive Living will;Out of facility DNR (pink MOST or yellow form)  Does patient want to make changes to medical advance directive? No - Patient declined  Copy of [place] in Chart? -  Would patient like information on creating a medical advance directive? -  Pre-existing out of facility DNR order (yellow form or pink MOST form) Yellow form placed in chart (order not valid for inpatient use)     Chief Complaint  Patient presents with  . Acute Visit    Not taking Medication/ Neck Pain    HPI:  Pt is a 86 y.o. female seen today for an acute visit for neck pain.   She is a resident of [facility], seen today at bedside. PMH includes: dementia with behavioral disturbance, senile osteoporosis, frailty, chronic kidney disease stage 3, and B12 deficiency.   On 10/08/20 she was found on the left side of her bed with the wheelchair on its back.  It appeared that she was sitting in her wheelchair and slipped forward towards the ground. Patient claims she was trying to get  her night gown. No apparent injuries at time of incident, neuro checks were initiated by nursing staff and on call NP and family were notified. Vital signs stable.   Today, nursing staff reports she will not take her medicine and is complaining of neck pain. During our encounter she states her neck hurts to move and it is painful to swallow pills. Also, claims drinking liquids and some food hurts to swallow. Staff denies any coughing after drinking or eating. She tries to demonstrate how far she can move her neck without pain. On the side of her right neck, a slight bruise is visible. No other injuries visible.    Past Medical History:  Diagnosis Date  . Anxiety   . Breast cancer [facility]) 2010   Right s/p right breast mastectomy  . Cancer ([facility])   . Depression   . H/O blood clots   . Neuropathic pain    Past Surgical History:  Procedure Laterality Date  . ABDOMINAL HYSTERECTOMY    . APPENDECTOMY    . BLADDER SURGERY    . BREAST SURGERY    . CHOLECYSTECTOMY    . GALLBLADDER SURGERY    . KNEE SURGERY    . MASTECTOMY    . TONSILLECTOMY      Allergies  Allergen Reactions  . Nsaids   . Other Other (See Comments)    Erythema Erythema  Renal insufficiency Renal insufficiency  Erythema Erythema   . Sulfa Antibiotics   . Donepezil Nausea Only  . Pregabalin Nausea Only    Allergies as of 10/12/2020  Reactions   Nsaids    Other Other (See Comments)   Erythema Erythema Renal insufficiency Renal insufficiency Erythema Erythema   Sulfa Antibiotics    Donepezil Nausea Only   Pregabalin Nausea Only      Medication List       Accurate as of October 12, 2020  4:05 PM. If you have any questions, ask your nurse or doctor.        STOP taking these medications   VITAMIN D3 PO Stopped by: Yvonna Alanis, NP     TAKE these medications   acetaminophen 325 MG tablet Commonly known as: TYLENOL Take 650 mg by mouth every 6 (six) hours as needed for mild pain or fever.     Calcium Oyster Shell 500 MG Tabs Take 1 tablet by mouth daily.   cyanocobalamin 1000 MCG tablet Take 1,000 mcg by mouth daily.   gabapentin 400 MG capsule Commonly known as: NEURONTIN Take 400 mg by mouth 2 (two) times daily. Total of 5 capsules per day   ICAPS AREDS 2 PO Take 1 capsule by mouth daily.   lidocaine 5 % ointment Commonly known as: XYLOCAINE Apply 1 application topically every 12 (twelve) hours as needed. For Pain   melatonin 3 MG Tabs tablet Take 3 mg by mouth at bedtime. FOR INSOMNIA   NON FORMULARY DIET: REGULAR   polyvinyl alcohol 1.4 % ophthalmic solution Commonly known as: LIQUIFILM TEARS Place 1 drop into both eyes 3 (three) times daily as needed for dry eyes.   promethazine 25 MG tablet Commonly known as: PHENERGAN Take 25 mg by mouth every 6 (six) hours as needed for nausea or vomiting.   sertraline 100 MG tablet Commonly known as: ZOLOFT Take 100 mg by mouth daily.       Review of Systems  Unable to perform ROS: Dementia    Immunization History  Administered Date(s) Administered  . Influenza, High Dose Seasonal PF 09/28/2014, 09/28/2014, 09/02/2015, 09/02/2015, 11/18/2018, 09/16/2019  . Influenza,inj,Quad PF,6+ Mos 09/08/2013  . Influenza-Unspecified 09/08/2013, 08/24/2016, 08/24/2016, 09/05/2017, 08/30/2020  . Moderna SARS-COVID-2 Vaccination 01/27/2020, 02/24/2020  . Pneumococcal Conjugate-13 02/28/2015, 02/28/2015  . Pneumococcal Polysaccharide-23 11/12/1994, 11/12/1994, 09/16/2019  . Tdap 12/23/2014   Pertinent  Health Maintenance Due  Topic Date Due  . DEXA SCAN  Never done  . INFLUENZA VACCINE  Completed  . PNA vac Low Risk Adult  Completed   Fall Risk  04/30/2020  Falls in the past year? 1  Number falls in past yr: 1  Injury with Fall? 1  Risk for fall due to : History of fall(s);Impaired balance/gait;Impaired mobility;Mental status change;Medication side effect;Orthopedic patient;Post anesthesia  Follow up Falls  evaluation completed;Education provided;Falls prevention discussed  Comment at [place]:    Vitals:   10/12/20 1601  BP: 110/68  Pulse: (!) 56  Resp: 16  Temp: 97.7 F (36.5 C)  SpO2: 97%  Weight: 122 lb (55.3 kg)  Height: 5\' 3"  (1.6 m)   Body mass index is 21.61 kg/m. Physical Exam Constitutional:      General: She is not in acute distress. HENT:     Head: Normocephalic.  Neck:     Vascular: No carotid bruit or JVD.     Trachea: Trachea normal.   Cardiovascular:     Rate and Rhythm: Normal rate and regular rhythm.     Pulses:          Radial pulses are 1+ on the right side.  Heart sounds: Normal heart sounds. No murmur heard.   Pulmonary:     Effort: Pulmonary effort is normal. No respiratory distress.     Breath sounds: Normal breath sounds. No wheezing.  Musculoskeletal:     Right upper arm: Normal.     Left upper arm: Normal.     Cervical back: Edema and tenderness present. Pain with movement present.     Thoracic back: Normal.     Lumbar back: Normal.     Right lower leg: No edema.     Left lower leg: No edema.  Lymphadenopathy:     Cervical: No cervical adenopathy.  Neurological:     Mental Status: She is alert. She is disoriented.     Motor: Weakness present.     Gait: Gait abnormal.  Psychiatric:        Attention and Perception: Attention normal.        Mood and Affect: Mood normal.        Speech: Speech normal.        Cognition and Memory: Memory is impaired.     Labs reviewed: Recent Labs    04/28/20 0000 05/09/20 0000  NA 136* 142  K 4.4 4.5  CL 105 109*  CO2 20 20  BUN 22* 33*  CREATININE 1.3* 1.2*  CALCIUM 8.6* 9.3   No results for input(s): AST, ALT, ALKPHOS, BILITOT, PROT, ALBUMIN in the last 8760 hours. Recent Labs    06/13/20 0000 06/21/20 0000 06/24/20 0000  WBC 6.7 6.8 6.4  HGB 9.2* 9.6* 9.9*  HCT 29* 31* 31*  PLT 218 208 202   No results found for: TSH No results found for:  HGBA1C No results found for: CHOL, HDL, LDLCALC, LDLDIRECT, TRIG, CHOLHDL  Significant Diagnostic Results in last 30 days:  No results found.  Assessment/Plan 1. Neck pain, acute - limited ROM with mild ecchymosis and tenderness present - suspect contusion, will x-ray to r/o fracture - consult for speech therapy - crush medications  - will increase tylenol to 1000 mg BID for pain - continue falls prevention plan  2. Dysphagia, unspecified type - consult to speech therapy - crush medications    Family/ staff Communication: Plan discussed with patient and facility nurse  Labs/tests ordered:  none

## 2020-10-19 ENCOUNTER — Encounter: Payer: Self-pay | Admitting: Orthopedic Surgery

## 2020-10-19 ENCOUNTER — Non-Acute Institutional Stay (SKILLED_NURSING_FACILITY): Payer: Medicare Other | Admitting: Orthopedic Surgery

## 2020-10-19 DIAGNOSIS — M542 Cervicalgia: Secondary | ICD-10-CM

## 2020-10-19 DIAGNOSIS — R54 Age-related physical debility: Secondary | ICD-10-CM

## 2020-10-19 DIAGNOSIS — F0391 Unspecified dementia with behavioral disturbance: Secondary | ICD-10-CM

## 2020-10-19 DIAGNOSIS — F321 Major depressive disorder, single episode, moderate: Secondary | ICD-10-CM

## 2020-10-19 DIAGNOSIS — N1832 Chronic kidney disease, stage 3b: Secondary | ICD-10-CM | POA: Diagnosis not present

## 2020-10-19 DIAGNOSIS — M810 Age-related osteoporosis without current pathological fracture: Secondary | ICD-10-CM | POA: Diagnosis not present

## 2020-10-19 DIAGNOSIS — G629 Polyneuropathy, unspecified: Secondary | ICD-10-CM

## 2020-10-19 DIAGNOSIS — Z6821 Body mass index (BMI) 21.0-21.9, adult: Secondary | ICD-10-CM

## 2020-10-19 NOTE — Progress Notes (Signed)
Location:    [facility] Room Number: 212/D Place of Service:  SNF [phone]) Provider:  Windell Moulding NP  Jonathon Bellows, PA-C  Patient Care Team: Jonathon Bellows, PA-C as PCP - General (Physician Assistant) Rehab, [place] ([place]) Kelvin Cellar, MD as Referring Physician (Orthopedic Surgery) [facility] Contact Information Primary Emergency Contact: Flegal,James Address: [address]          [place], [place] [address] Johnnette Litter of [place] Phone: [phone] Mobile Phone: [phone] Relation: Son Secondary Emergency Contact: [place] Mobile Phone: [phone] Relation: Son  Code Status:  DNR Goals of care: Advanced Directive information Advanced Directives 10/19/2020  Does Patient Have a Medical Advance Directive? Yes  Type of Advance Directive Living will;Out of facility DNR (pink MOST or yellow form)  Does patient want to make changes to medical advance directive? No - Patient declined  Copy of [place] in Chart? -  Would patient like information on creating a medical advance directive? -  Pre-existing out of facility DNR order (yellow form or pink MOST form) Yellow form placed in chart (order not valid for inpatient use)     Chief Complaint  Patient presents with  . Medical Management of Chronic Issues    Routine Visit of Medical Management    HPI:  Pt is a 86 y.o. female seen today for medical management of chronic diseases.    She is a resident of [facility], seen today at bedside. Past medical history includes: dementia with behavioral disturbance, senile osteoporosis, chronic kidney disease stage III, and frailty.   Today, she is alert, orientated to self and person. [facility] name facility she is at. Does not know day of week or year, but knows she will be 89 in a few days. She can express  needs and follow commands. 11/27 she was found on the side of her bed. No apparent injuries at time of incident, but a few days later she complained of neck pain. X-ray negative for fracture. She states her pain has improved since incident. Receiving tylenol 650 mg for pain as needed. PT and OT services d/c 12/7. She remains maximum assist with transfers to bed and wheelchair.   11/4 she tested positive for covid-19 and was placed on isolation. She has since recovered and returned to her room. She denies any cold symptoms or loss of taste and smell.   She was very happy to have company today and showed  me her fingernails. Family friend came by a few weeks ago and painted them red. She is very happy they still look like they were just painted.   Still colors with her roommate during the day. Working on [employer] Does not like to watch television.   She states her appetite is fair. Likes to drink coffee in morning. Eats 1-2 meals a day. Claims she is hungry till the food comes, then she looses her appetite. ST evaluation completed 12/7, no further workup recommended. She gets weighted in middle of the month.    Last recorded weight are as follows:  09/13- 127.6 lbs  10/15- 125.4 lbs  11/12- 122 lbs  Last recorded blood pressures are as follows:   12/6- 130/59  12/7- 115/57  12/8- 113/64  Facility nurse does not report any new concerns. No reports of agitation or hallucinations. Vital signs stable at this time.  Past Medical History:  Diagnosis Date  . Anxiety   . Breast cancer [facility]) 2010   Right s/p right breast mastectomy  . Cancer ([place])   . Depression   . H/O blood clots   . Neuropathic pain    Past Surgical History:  Procedure Laterality Date  . ABDOMINAL HYSTERECTOMY    . APPENDECTOMY    . BLADDER SURGERY    . BREAST SURGERY    . CHOLECYSTECTOMY    . GALLBLADDER SURGERY    . KNEE SURGERY    . MASTECTOMY    . TONSILLECTOMY      Allergies  Allergen  Reactions  . Nsaids   . Other Other (See Comments)    Erythema Erythema  Renal insufficiency Renal insufficiency  Erythema Erythema   . Sulfa Antibiotics   . Donepezil Nausea Only  . Pregabalin Nausea Only    Allergies as of 10/19/2020      Reactions   Nsaids    Other Other (See Comments)   Erythema Erythema Renal insufficiency Renal insufficiency Erythema Erythema   Sulfa Antibiotics    Donepezil Nausea Only   Pregabalin Nausea Only      Medication List       Accurate as of October 19, 2020  9:07 AM. If you have any questions, ask your nurse or doctor.        acetaminophen 325 MG tablet Commonly known as: TYLENOL Take 650 mg by mouth every 6 (six) hours as needed for mild pain or fever.   Calcium Oyster Shell 500 MG Tabs Take 1 tablet by mouth daily.   cyanocobalamin 1000 MCG tablet Take 1,000 mcg by mouth daily.   gabapentin 400 MG capsule Commonly known as: NEURONTIN Take 400 mg by mouth 2 (two) times daily. Total of 5 capsules per day   ICAPS AREDS 2 PO Take 1 capsule by mouth daily.   lidocaine 5 % ointment Commonly known as: XYLOCAINE Apply 1 application topically every 12 (twelve) hours as needed. For Pain   melatonin 3 MG Tabs tablet Take 3 mg by mouth at bedtime. FOR INSOMNIA   NON FORMULARY DIET: REGULAR   polyvinyl alcohol 1.4 % ophthalmic solution Commonly known as: LIQUIFILM TEARS Place 1 drop into both eyes 3 (three) times daily as needed for dry eyes.   promethazine 25 MG tablet Commonly known as: PHENERGAN Take 25 mg by mouth every 6 (six) hours as needed for nausea or vomiting.   sertraline 100 MG tablet Commonly known as: ZOLOFT Take 100 mg by mouth daily.       Review of Systems  Constitutional: Negative for activity change, fatigue and fever.       Decreased weight  HENT: Positive for hearing loss. Negative for congestion, sore throat and trouble swallowing.   Respiratory: Negative for cough, shortness of breath  and wheezing.   Cardiovascular: Negative for chest pain and leg swelling.  Gastrointestinal: Negative for abdominal pain and constipation.  Genitourinary: Negative for dysuria, frequency and hematuria.  Musculoskeletal: Positive for arthralgias and neck pain.        history of fall  Skin:       Dry, thin skin  Neurological: Positive for weakness. Negative for dizziness, numbness and headaches.  Hematological: Bruises/bleeds easily.  Psychiatric/Behavioral: Positive for confusion. Negative for dysphoric mood and sleep disturbance. The patient is not nervous/anxious.     Immunization History  Administered Date(s) Administered  . Influenza, High Dose Seasonal PF 09/28/2014, 09/28/2014, 09/02/2015, 09/02/2015, 11/18/2018, 09/16/2019  .  Influenza,inj,Quad PF,6+ Mos 09/08/2013  . Influenza-Unspecified 09/08/2013, 08/24/2016, 08/24/2016, 09/05/2017, 08/30/2020  . Moderna SARS-COVID-2 Vaccination 01/27/2020, 02/24/2020  . Pneumococcal Conjugate-13 02/28/2015, 02/28/2015  . Pneumococcal Polysaccharide-23 11/12/1994, 11/12/1994, 09/16/2019  . Tdap 12/23/2014   Pertinent  Health Maintenance Due  Topic Date Due  . DEXA SCAN  Never done  . INFLUENZA VACCINE  Completed  . PNA vac Low Risk Adult  Completed   Fall Risk  04/30/2020  Falls in the past year? 1  Number falls in past yr: 1  Injury with Fall? 1  Risk for fall due to : History of fall(s);Impaired balance/gait;Impaired mobility;Mental status change;Medication side effect;Orthopedic patient;Post anesthesia  Follow up Falls evaluation completed;Education provided;Falls prevention discussed  Comment at [place]:    Vitals:   10/19/20 0903  BP: (!) 115/57  Pulse: 60  Resp: 17  Temp: (!) 97.5 F (36.4 C)  SpO2: 97%  Weight: 122 lb (55.3 kg)  Height: 5\' 3"  (1.6 m)   Body mass index is 21.61 kg/m. Physical Exam Vitals reviewed.  Constitutional:      General: She is not in acute distress.     Appearance: Normal appearance.     Comments: Frail  HENT:     Head: Normocephalic.     Right Ear: There is no impacted cerumen.     Nose: No congestion.     Mouth/Throat:     Mouth: Mucous membranes are moist.     Pharynx: No posterior oropharyngeal erythema.  Eyes:     Extraocular Movements: Extraocular movements intact.     Pupils: Pupils are equal, round, and reactive to light.  Cardiovascular:     Rate and Rhythm: Normal rate and regular rhythm.     Pulses: Normal pulses.     Heart sounds: Normal heart sounds. No murmur heard.   Pulmonary:     Effort: Pulmonary effort is normal. No respiratory distress.     Breath sounds: Normal breath sounds. No wheezing.  Abdominal:     General: Abdomen is flat. Bowel sounds are normal. There is no distension.     Palpations: Abdomen is soft.     Tenderness: There is no abdominal tenderness.  Musculoskeletal:     Right lower leg: No edema.     Left lower leg: No edema.  Skin:    General: Skin is warm and dry.     Capillary Refill: Capillary refill takes 2 to 3 seconds.  Neurological:     General: No focal deficit present.     Mental Status: She is alert. Mental status is at baseline.     Motor: Weakness present.     Gait: Gait abnormal.  Psychiatric:        Attention and Perception: Attention normal.        Mood and Affect: Mood normal.        Cognition and Memory: Memory is impaired.     Labs reviewed: Recent Labs    04/28/20 0000 05/09/20 0000  NA 136* 142  K 4.4 4.5  CL 105 109*  CO2 20 20  BUN 22* 33*  CREATININE 1.3* 1.2*  CALCIUM 8.6* 9.3   No results for input(s): AST, ALT, ALKPHOS, BILITOT, PROT, ALBUMIN in the last 8760 hours. Recent Labs    06/13/20 0000 06/21/20 0000 06/24/20 0000  WBC 6.7 6.8 6.4  HGB 9.2* 9.6* 9.9*  HCT 29* 31* 31*  PLT 218 208 202   No results found for: TSH No results  found for: HGBA1C No results found for: CHOL, HDL, LDLCALC, LDLDIRECT, TRIG, CHOLHDL  Significant Diagnostic  Results in last 30 days:  No results found.  Assessment/Plan 1. Dementia with behavioral disturbance, unspecified dementia type (HCC) - stable, no reported agitation or hallucinations  2. Chronic kidney disease, stage 3b ([place]) - continue to avoid nephrotoxic drugs and dose adjust medication to be renally excreted  3. Senile osteoporosis - she remains a high fall risk with fracture - continue falls safety plan - continue daily calcium supplement  4. Frailty syndrome in geriatric patient - ongoing due to advancing age - she has lost 3 lbs within the past month - recommend starting ensure PO BID between meals  5. Body mass index (BMI) 21.0-21.9, adult - in past month she has had covid-19 and neck pain, will monitor weight for next month -continue monthly weights, may switch to weekly if weight decreases in January 2022  6. Neck pain, acute - stable, she states her pain has reduced since sincident - continue tylenol 650 mg PRN Q6 hrs for pain  7. Current moderate episode of major depressive disorder, unspecified whether recurrent ([place]) - stable with medication - continue daily Zoloft regimen  8. Neuropathy - stable with medication - continue gabapentin 400 mg PO BID    Family/ staff Communication: Plan discussed with patient and facility nurse.  Labs/tests ordered: none

## 2020-10-26 ENCOUNTER — Non-Acute Institutional Stay (SKILLED_NURSING_FACILITY): Payer: Medicare Other | Admitting: Orthopedic Surgery

## 2020-10-26 ENCOUNTER — Encounter: Payer: Self-pay | Admitting: Orthopedic Surgery

## 2020-10-26 DIAGNOSIS — K567 Ileus, unspecified: Secondary | ICD-10-CM

## 2020-10-26 LAB — CBC: RBC: 4.15 (ref 3.87–5.11)

## 2020-10-26 LAB — BASIC METABOLIC PANEL
BUN: 75 — AB (ref 4–21)
CO2: 17 (ref 13–22)
Chloride: 102 (ref 99–108)
Creatinine: 3 — AB (ref 0.5–1.1)
Glucose: 86
Potassium: 5.6 — AB (ref 3.4–5.3)
Sodium: 138 (ref 137–147)

## 2020-10-26 LAB — CBC AND DIFFERENTIAL
HCT: 36 (ref 36–46)
Hemoglobin: 11.7 — AB (ref 12.0–16.0)
Platelets: 286 (ref 150–399)
WBC: 17

## 2020-10-26 LAB — COMPREHENSIVE METABOLIC PANEL
Calcium: 9.3 (ref 8.7–10.7)
GFR calc Af Amer: <15
GFR calc non Af Amer: <15

## 2020-10-26 NOTE — Progress Notes (Signed)
Location:    [facility] Room Number: 212/D Place of Service:  SNF [phone]) Provider:  Windell Moulding NP  Jonathon Bellows, PA-C  Patient Care Team: Jonathon Bellows, PA-C as PCP - General (Physician Assistant) Rehab, [place] ([place]) Kelvin Cellar, MD as Referring Physician (Orthopedic Surgery) [place] Emergency Contact Information Primary Emergency Contact: Shon,James Address: [address]          [place], [place] [address] Johnnette Litter of [place] Phone: [phone] Mobile Phone: [phone] Relation: Son Secondary Emergency Contact: [place] Mobile Phone: [phone] Relation: Son  Code Status: DNR  Goals of care: Advanced Directive information Advanced Directives 10/26/2020  Does Patient Have a Medical Advance Directive? Yes  Type of Advance Directive Living will;Out of facility DNR (pink MOST or yellow form)  Does patient want to make changes to medical advance directive? No - Patient declined  Copy of [place] in Chart? -  Would patient like information on creating a medical advance directive? -  Pre-existing out of facility DNR order (yellow form or pink MOST form) Yellow form placed in chart (order not valid for inpatient use)     Chief Complaint  Patient presents with  . Acute Visit    Abdominal Pain    HPI:  Pt is a 86 y.o. female seen today for an acute visit for abdominal pain.   She is a resident of [facility], seen at bedside today. PMH includes: dementia without disturbance, osteoporosis, chronic kidney disease stage III, B12 deficiency, and frailty.   Facility nurse reports she has complained of abdominal pain since last night. She has also become more agitated and not eating well. Sunday she had a large bowel movement. Monday she was nauseous and vomited.   Today, she is alert to self,  disorientated to person, place and time. Follows commands and can express some needs. She becomes angry when I lightly press over her LLQ. Tries to move my hand with hers. Facility nurse reported low oxygenation on room air. She was placed on 2 liters and oxygenation improved.   Her roommate states she has not been the same person for a few days. States they color together in the afternoon, but have not lately.   Son, Jeneen Rinks notified about mothers condition.    Past Medical History:  Diagnosis Date  . Anxiety   . Breast cancer [facility]) 2010   Right s/p right breast mastectomy  . Cancer ([place])   . Depression   . H/O blood clots   . Neuropathic pain    Past Surgical History:  Procedure Laterality Date  . ABDOMINAL HYSTERECTOMY    . APPENDECTOMY    . BLADDER SURGERY    . BREAST SURGERY    . CHOLECYSTECTOMY    . GALLBLADDER SURGERY    . KNEE SURGERY    . MASTECTOMY    . TONSILLECTOMY      Allergies  Allergen Reactions  . Nsaids   . Other Other (See Comments)    Erythema Erythema  Renal insufficiency Renal insufficiency  Erythema Erythema   . Sulfa Antibiotics   . Donepezil Nausea Only  . Pregabalin Nausea Only    Allergies as of 10/26/2020      Reactions   Nsaids    Other Other (See Comments)   Erythema Erythema Renal insufficiency Renal insufficiency Erythema Erythema   Sulfa Antibiotics  Donepezil Nausea Only   Pregabalin Nausea Only      Medication List       Accurate as of October 26, 2020 10:20 AM. If you have any questions, ask your nurse or doctor.        acetaminophen 325 MG tablet Commonly known as: TYLENOL Take 650 mg by mouth every 6 (six) hours as needed for mild pain or fever.   Calcium Oyster Shell 500 MG Tabs Take 1 tablet by mouth daily.   cyanocobalamin 1000 MCG tablet Take 1,000 mcg by mouth daily.   ENSURE PO Take by mouth. ENSURE LIQUID: Give twice a day by mouth between meals   gabapentin 400 MG capsule Commonly known  as: NEURONTIN Take 400 mg by mouth 2 (two) times daily. Total of 5 capsules per day   ICAPS AREDS 2 PO Take 1 capsule by mouth daily.   lidocaine 5 % ointment Commonly known as: XYLOCAINE Apply 1 application topically every 12 (twelve) hours as needed. For Pain   melatonin 3 MG Tabs tablet Take 3 mg by mouth at bedtime. FOR INSOMNIA   NON FORMULARY DIET: REGULAR   polyvinyl alcohol 1.4 % ophthalmic solution Commonly known as: LIQUIFILM TEARS Place 1 drop into both eyes 3 (three) times daily as needed for dry eyes.   promethazine 25 MG tablet Commonly known as: PHENERGAN Take 25 mg by mouth every 6 (six) hours as needed for nausea or vomiting.   sertraline 100 MG tablet Commonly known as: ZOLOFT Take 100 mg by mouth daily.       Review of Systems  Unable to perform ROS: Dementia    Immunization History  Administered Date(s) Administered  . Influenza, High Dose Seasonal PF 09/28/2014, 09/28/2014, 09/02/2015, 09/02/2015, 11/18/2018, 09/16/2019  . Influenza,inj,Quad PF,6+ Mos 09/08/2013  . Influenza-Unspecified 09/08/2013, 08/24/2016, 08/24/2016, 09/05/2017, 08/30/2020  . Moderna Sars-Covid-2 Vaccination 01/27/2020, 02/24/2020  . Pneumococcal Conjugate-13 02/28/2015, 02/28/2015  . Pneumococcal Polysaccharide-23 11/12/1994, 11/12/1994, 09/16/2019  . Tdap 12/23/2014   Pertinent  Health Maintenance Due  Topic Date Due  . DEXA SCAN  Never done  . INFLUENZA VACCINE  Completed  . PNA vac Low Risk Adult  Completed   Fall Risk  04/30/2020  Falls in the past year? 1  Number falls in past yr: 1  Injury with Fall? 1  Risk for fall due to : History of fall(s);Impaired balance/gait;Impaired mobility;Mental status change;Medication side effect;Orthopedic patient;Post anesthesia  Follow up Falls evaluation completed;Education provided;Falls prevention discussed  Comment at [place]:    Vitals:   10/26/20 1019  BP: (!) 99/54  Pulse: 70  Resp:  18  Temp: 97.6 F (36.4 C)  SpO2: 97%  Weight: 122 lb (55.3 kg)  Height: 5\' 3"  (1.6 m)   Body mass index is 21.61 kg/m. Physical Exam Constitutional:      General: She is not in acute distress. Cardiovascular:     Rate and Rhythm: Normal rate and regular rhythm.     Pulses: Normal pulses.     Heart sounds: Normal heart sounds. No murmur heard.   Pulmonary:     Effort: Pulmonary effort is normal. No respiratory distress.     Breath sounds: Normal breath sounds. No wheezing.  Abdominal:     General: Abdomen is flat. There is no distension.     Palpations: Abdomen is soft.     Tenderness: There is abdominal tenderness.     Hernia: No hernia is present.  Comments: Hypotensive bowel sounds x4  Musculoskeletal:     Right lower leg: No edema.     Left lower leg: No edema.  Skin:    General: Skin is warm and dry.     Capillary Refill: Capillary refill takes less than 2 seconds.  Neurological:     Mental Status: She is alert. She is disoriented.     Motor: Weakness present.     Gait: Gait abnormal.  Psychiatric:        Mood and Affect: Mood normal.        Speech: Speech is delayed.        Behavior: Behavior normal.        Cognition and Memory: Memory is impaired.     Labs reviewed: Recent Labs    04/28/20 0000 05/09/20 0000 08/17/20 0000 10/05/20 0000  NA 136* 142  --   --   K 4.4 4.5  --   --   CL 105 109*  --   --   CO2 20 20  --   --   BUN 22* 33*  --   --   CREATININE 1.3* 1.2*  --   --   CALCIUM 8.6* 9.3 9.1 9.2   No results for input(s): AST, ALT, ALKPHOS, BILITOT, PROT, ALBUMIN in the last 8760 hours. Recent Labs    06/13/20 0000 06/21/20 0000 06/24/20 0000  WBC 6.7 6.8 6.4  HGB 9.2* 9.6* 9.9*  HCT 29* 31* 31*  PLT 218 208 202   No results found for: TSH No results found for: HGBA1C No results found for: CHOL, HDL, LDLCALC, LDLDIRECT, TRIG, CHOLHDL  Significant Diagnostic Results in last 30 days:  No results  found.  Assessment/Plan: 1. Ileus (HCC) - tenderness over LLQ, bowel sounds hypoactive - KUB today confirms diffuse illeus - CT abdomen and pelvis with contrast recommended if condition worsens - start IV - start normal saline 100cc/hr x 2 liters - NPO except sips with meds  - cbc/diff- today - bmp- today - decrease phenergan to 12.5 mg Q6 prn for nausea - will see how she is tomorrow and update son  Family/ staff Communication: Plan discussed with patient, son and facility nurse  Labs/tests ordered:  Cbc/diff, bmp, KUB, UA/Culture

## 2020-10-27 ENCOUNTER — Non-Acute Institutional Stay (SKILLED_NURSING_FACILITY): Payer: Medicare Other | Admitting: Orthopedic Surgery

## 2020-10-27 ENCOUNTER — Encounter: Payer: Self-pay | Admitting: Orthopedic Surgery

## 2020-10-27 DIAGNOSIS — K567 Ileus, unspecified: Secondary | ICD-10-CM

## 2020-10-27 NOTE — Progress Notes (Signed)
Location:    [facility] Room Number: 212/D Place of Service:  SNF [phone]) Provider:  Windell Moulding NP  Jonathon Bellows, PA-C  Patient Care Team: Jonathon Bellows, PA-C as PCP - General (Physician Assistant) [facility], [facility]) Kelvin Cellar, MD as Referring Physician (Orthopedic Surgery) [facility] Contact Information Primary Emergency Contact: Newberry,James Address: [address]          [place], [place] [address] Johnnette Litter of [place] Phone: [phone] Mobile Phone: [phone] Relation: Son Secondary Emergency Contact: [place] Mobile Phone: [phone] Relation: Son  Code Status: DNR  Goals of care: Advanced Directive information Advanced Directives 10/27/2020  Does Patient Have a Medical Advance Directive? Yes  Type of Advance Directive Living will;Out of facility DNR (pink MOST or yellow form)  Does patient want to make changes to medical advance directive? No - Patient declined  Copy of [place] in Chart? -  Would patient like information on creating a medical advance directive? -  Pre-existing out of facility DNR order (yellow form or pink MOST form) Yellow form placed in chart (order not valid for inpatient use)     Chief Complaint  Patient presents with  . Acute Visit    Elevated WBC, Lethargic     HPI:  Pt is a 86 y.o. female seen today for an acute visit for ileus.   She is a resident of [facility], seen at bedside today. PMH includes: dementia with behavioral disturbance, osteoporosis, chronic kidney disease stage III, B12 deficiency, and frality.   Yesterday, facility nurse was concerned she was having abdominal pain. On exam she had tenderness in the LLQ. Abdominal x-ray stated diffuse ileus. She was made NPO and IV fluids initiated. This morning, she is more lethargic and feels  hot. Oral temp of 98.9 at bedside. She cannot express needs or follow commands. Potassium level 6.1, BUN 91.9, creatinine 3.41, wbc 17.   Her son, Jeneen Rinks was notified about her change in health. He informed me that he wanted to send her to [facility] for further evaluation.      Past Medical History:  Diagnosis Date  . Anxiety   . Breast cancer [facility]) 2010   Right s/p right breast mastectomy  . Cancer ([place])   . Depression   . H/O blood clots   . Neuropathic pain    Past Surgical History:  Procedure Laterality Date  . ABDOMINAL HYSTERECTOMY    . APPENDECTOMY    . BLADDER SURGERY    . BREAST SURGERY    . CHOLECYSTECTOMY    . GALLBLADDER SURGERY    . KNEE SURGERY    . MASTECTOMY    . TONSILLECTOMY      Allergies  Allergen Reactions  . Nsaids   . Other Other (See Comments)    Erythema Erythema  Renal insufficiency Renal insufficiency  Erythema Erythema   . Sulfa Antibiotics   . Donepezil Nausea Only  . Pregabalin Nausea Only    Allergies as of 10/27/2020      Reactions   Nsaids    Other Other (See Comments)   Erythema Erythema Renal insufficiency Renal insufficiency Erythema Erythema   Sulfa Antibiotics    Donepezil Nausea Only   Pregabalin Nausea Only      Medication List       Accurate as of October 27, 2020  9:43 AM. If  you have any questions, ask your nurse or doctor.        acetaminophen 325 MG tablet Commonly known as: TYLENOL Take 650 mg by mouth every 6 (six) hours as needed for mild pain or fever.   Calcium Oyster Shell 500 MG Tabs Take 1 tablet by mouth daily.   cyanocobalamin 1000 MCG tablet Take 1,000 mcg by mouth daily.   ENSURE PO Take by mouth. ENSURE LIQUID: Give twice a day by mouth between meals   gabapentin 400 MG capsule Commonly known as: NEURONTIN Take 400 mg by mouth 2 (two) times daily. Total of 5 capsules per day   ICAPS AREDS 2 PO Take 1 capsule by mouth daily.   lidocaine 5 % ointment Commonly  known as: XYLOCAINE Apply 1 application topically every 12 (twelve) hours as needed. For Pain   melatonin 3 MG Tabs tablet Take 3 mg by mouth at bedtime. FOR INSOMNIA   NON FORMULARY DIET: REGULAR   polyvinyl alcohol 1.4 % ophthalmic solution Commonly known as: LIQUIFILM TEARS Place 1 drop into both eyes 3 (three) times daily as needed for dry eyes.   promethazine 25 MG tablet Commonly known as: PHENERGAN Take 12.5 mg by mouth every 6 (six) hours as needed for nausea or vomiting.   sertraline 100 MG tablet Commonly known as: ZOLOFT Take 100 mg by mouth daily.       Review of Systems  Unable to perform ROS: Dementia    Immunization History  Administered Date(s) Administered  . Influenza, High Dose Seasonal PF 09/28/2014, 09/28/2014, 09/02/2015, 09/02/2015, 11/18/2018, 09/16/2019  . Influenza,inj,Quad PF,6+ Mos 09/08/2013  . Influenza-Unspecified 09/08/2013, 08/24/2016, 08/24/2016, 09/05/2017, 08/30/2020  . Moderna Sars-Covid-2 Vaccination 01/27/2020, 02/24/2020  . Pneumococcal Conjugate-13 02/28/2015, 02/28/2015  . Pneumococcal Polysaccharide-23 11/12/1994, 11/12/1994, 09/16/2019  . Tdap 12/23/2014   Pertinent  Health Maintenance Due  Topic Date Due  . DEXA SCAN  Never done  . INFLUENZA VACCINE  Completed  . PNA vac Low Risk Adult  Completed   Fall Risk  04/30/2020  Falls in the past year? 1  Number falls in past yr: 1  Injury with Fall? 1  Risk for fall due to : History of fall(s);Impaired balance/gait;Impaired mobility;Mental status change;Medication side effect;Orthopedic patient;Post anesthesia  Follow up Falls evaluation completed;Education provided;Falls prevention discussed  Comment at [place]:    Vitals:   10/27/20 0941  BP: (!) 99/54  Pulse: 70  Resp: 18  Temp: 97.6 F (36.4 C)  SpO2: 97%  Weight: 120 lb (54.4 kg)  Height: 5\' 3"  (1.6 m)   Body mass index is 21.26 kg/m. Physical Exam Vitals reviewed.   Constitutional:      Appearance: She is ill-appearing.     Comments: letharigic  Cardiovascular:     Rate and Rhythm: Normal rate and regular rhythm.     Pulses: Normal pulses.     Heart sounds: Normal heart sounds. No murmur heard.   Pulmonary:     Effort: Pulmonary effort is normal. No respiratory distress.     Breath sounds: Normal breath sounds. No wheezing.  Abdominal:     General: Abdomen is flat. There is no distension.     Palpations: Abdomen is soft.     Tenderness: There is abdominal tenderness.     Comments: BS hypoactive x 4, LLQ tenderness  Musculoskeletal:     Right lower leg: No edema.     Left lower leg: No edema.  Skin:    General:  Skin is warm and dry.     Capillary Refill: Capillary refill takes less than 2 seconds.  Neurological:     Mental Status: Mental status is at baseline. She is disoriented.     Motor: Weakness present.     Gait: Gait abnormal.  Psychiatric:        Attention and Perception: She is inattentive.        Cognition and Memory: Memory is impaired.     Labs reviewed: Recent Labs    04/28/20 0000 05/09/20 0000 08/17/20 0000 10/05/20 0000  NA 136* 142  --   --   K 4.4 4.5  --   --   CL 105 109*  --   --   CO2 20 20  --   --   BUN 22* 33*  --   --   CREATININE 1.3* 1.2*  --   --   CALCIUM 8.6* 9.3 9.1 9.2   No results for input(s): AST, ALT, ALKPHOS, BILITOT, PROT, ALBUMIN in the last 8760 hours. Recent Labs    06/13/20 0000 06/21/20 0000 06/24/20 0000  WBC 6.7 6.8 6.4  HGB 9.2* 9.6* 9.9*  HCT 29* 31* 31*  PLT 218 208 202   No results found for: TSH No results found for: HGBA1C No results found for: CHOL, HDL, LDLCALC, LDLDIRECT, TRIG, CHOLHDL  Significant Diagnostic Results in last 30 days:  No results found.  Assessment/Plan 1. Ileus ([place]) - she has become lethargic and running a low grade fever  - wbc elevated to 17 - critical potassium 6.1, and BUN 90.1 - after talking with son, I have advised facility nurse  to send patient to [facility] for [facility] evaluation - LR at 100cc/hr started while waiting for EMS    Family/ staff Communication: Plan discussed with son Jeneen Rinks) and facility nurse  Labs/tests ordered:  none

## 2020-11-12 DEATH — deceased
